# Patient Record
Sex: Male | Born: 1975
Health system: Southern US, Community
[De-identification: ages and names within clinical notes are randomized; demographics above are authoritative.]

## PROBLEM LIST (undated history)

## (undated) DIAGNOSIS — M549 Dorsalgia, unspecified: Secondary | ICD-10-CM

## (undated) DIAGNOSIS — F329 Major depressive disorder, single episode, unspecified: Secondary | ICD-10-CM

## (undated) HISTORY — PX: TONSILLECTOMY: SUR1361

## (undated) HISTORY — PX: OTHER SURGICAL HISTORY: SHX169

## (undated) HISTORY — DX: Major depressive disorder, single episode, unspecified: F32.9

## (undated) HISTORY — PX: HERNIA REPAIR: SHX51

---

## 2007-01-26 ENCOUNTER — Encounter: Payer: Self-pay | Admitting: Family Medicine

## 2007-03-09 ENCOUNTER — Encounter: Payer: Self-pay | Admitting: Family Medicine

## 2007-03-21 ENCOUNTER — Encounter: Admission: RE | Admit: 2007-03-21 | Discharge: 2007-03-21 | Payer: Self-pay | Admitting: Sports Medicine

## 2007-04-04 ENCOUNTER — Encounter: Admission: RE | Admit: 2007-04-04 | Discharge: 2007-07-03 | Payer: Self-pay | Admitting: Sports Medicine

## 2007-07-31 ENCOUNTER — Encounter: Payer: Self-pay | Admitting: Family Medicine

## 2007-08-07 ENCOUNTER — Ambulatory Visit: Payer: Self-pay | Admitting: Family Medicine

## 2007-08-08 ENCOUNTER — Ambulatory Visit: Payer: Self-pay | Admitting: Vascular Surgery

## 2007-08-09 ENCOUNTER — Telehealth: Payer: Self-pay | Admitting: Family Medicine

## 2007-08-10 ENCOUNTER — Encounter: Payer: Self-pay | Admitting: Family Medicine

## 2007-08-13 ENCOUNTER — Ambulatory Visit: Payer: Self-pay | Admitting: Family Medicine

## 2007-08-14 LAB — CONVERTED CEMR LAB
Basophils Relative: 1 % (ref 0–1)
Eosinophils Absolute: 0.4 10*3/uL (ref 0.0–0.7)
Lymphocytes Relative: 22 % (ref 12–46)
MCV: 89 fL (ref 78.0–100.0)
Monocytes Absolute: 0.4 10*3/uL (ref 0.1–1.0)
Monocytes Relative: 5 % (ref 3–12)
Neutrophils Relative %: 68 % (ref 43–77)
Platelets: 200 10*3/uL (ref 150–400)
RBC: 5 M/uL (ref 4.22–5.81)
RDW: 12.6 % (ref 11.5–15.5)
WBC: 8.2 10*3/uL (ref 4.0–10.5)

## 2007-08-16 ENCOUNTER — Ambulatory Visit: Payer: Self-pay | Admitting: Family Medicine

## 2007-08-16 DIAGNOSIS — F329 Major depressive disorder, single episode, unspecified: Secondary | ICD-10-CM

## 2007-08-16 DIAGNOSIS — F3289 Other specified depressive episodes: Secondary | ICD-10-CM

## 2007-08-16 HISTORY — DX: Other specified depressive episodes: F32.89

## 2007-08-16 HISTORY — DX: Major depressive disorder, single episode, unspecified: F32.9

## 2007-08-17 ENCOUNTER — Ambulatory Visit: Payer: Self-pay | Admitting: Sports Medicine

## 2007-08-17 ENCOUNTER — Telehealth: Payer: Self-pay | Admitting: Family Medicine

## 2007-08-18 ENCOUNTER — Encounter: Payer: Self-pay | Admitting: Sports Medicine

## 2007-08-27 ENCOUNTER — Encounter: Payer: Self-pay | Admitting: Family Medicine

## 2008-07-09 ENCOUNTER — Ambulatory Visit: Payer: Self-pay | Admitting: Family Medicine

## 2008-07-21 ENCOUNTER — Encounter: Payer: Self-pay | Admitting: Family Medicine

## 2008-07-28 ENCOUNTER — Encounter: Payer: Self-pay | Admitting: Family Medicine

## 2008-08-11 ENCOUNTER — Encounter: Payer: Self-pay | Admitting: Family Medicine

## 2008-10-01 ENCOUNTER — Ambulatory Visit: Payer: Self-pay | Admitting: Family Medicine

## 2008-10-02 ENCOUNTER — Encounter: Payer: Self-pay | Admitting: Family Medicine

## 2008-10-07 LAB — CONVERTED CEMR LAB
Sed Rate: 2 mm/hr (ref 0–16)
Total CK: 49 units/L (ref 7–232)

## 2009-01-11 ENCOUNTER — Ambulatory Visit: Payer: Self-pay | Admitting: Family Medicine

## 2009-02-02 ENCOUNTER — Ambulatory Visit: Payer: Self-pay | Admitting: Family Medicine

## 2009-02-03 ENCOUNTER — Encounter (INDEPENDENT_AMBULATORY_CARE_PROVIDER_SITE_OTHER): Payer: Self-pay | Admitting: *Deleted

## 2009-02-04 ENCOUNTER — Encounter: Payer: Self-pay | Admitting: Family Medicine

## 2009-02-05 ENCOUNTER — Telehealth: Payer: Self-pay | Admitting: Family Medicine

## 2009-02-05 ENCOUNTER — Encounter (INDEPENDENT_AMBULATORY_CARE_PROVIDER_SITE_OTHER): Payer: Self-pay | Admitting: *Deleted

## 2009-02-05 LAB — CONVERTED CEMR LAB
ALT: 11 units/L (ref 0–53)
Albumin: 4.7 g/dL (ref 3.5–5.2)
BUN: 16 mg/dL (ref 6–23)
Creatinine, Ser: 0.89 mg/dL (ref 0.40–1.50)
Glucose, Bld: 85 mg/dL (ref 70–99)
HDL: 33 mg/dL — ABNORMAL LOW (ref >39)
LDL Cholesterol: 108 mg/dL — ABNORMAL HIGH (ref 0–99)
Sodium: 143 meq/L (ref 135–145)
Total Protein: 7 g/dL (ref 6.0–8.3)

## 2009-02-10 ENCOUNTER — Encounter: Payer: Self-pay | Admitting: Family Medicine

## 2009-02-13 ENCOUNTER — Encounter: Payer: Self-pay | Admitting: Family Medicine

## 2009-04-29 ENCOUNTER — Telehealth: Payer: Self-pay | Admitting: Family Medicine

## 2009-06-22 ENCOUNTER — Ambulatory Visit: Payer: Self-pay | Admitting: Occupational Medicine

## 2009-06-25 ENCOUNTER — Encounter: Payer: Self-pay | Admitting: Family Medicine

## 2009-07-06 ENCOUNTER — Encounter: Admission: RE | Admit: 2009-07-06 | Discharge: 2009-07-06 | Payer: Self-pay | Admitting: Family Medicine

## 2009-07-06 ENCOUNTER — Ambulatory Visit: Payer: Self-pay | Admitting: Family Medicine

## 2009-07-20 ENCOUNTER — Telehealth: Payer: Self-pay | Admitting: Family Medicine

## 2009-08-21 ENCOUNTER — Encounter: Payer: Self-pay | Admitting: Family Medicine

## 2009-09-03 ENCOUNTER — Ambulatory Visit: Payer: Self-pay | Admitting: Family Medicine

## 2009-09-03 ENCOUNTER — Encounter (INDEPENDENT_AMBULATORY_CARE_PROVIDER_SITE_OTHER): Payer: Self-pay | Admitting: *Deleted

## 2009-09-03 DIAGNOSIS — B360 Pityriasis versicolor: Secondary | ICD-10-CM

## 2009-10-13 ENCOUNTER — Encounter: Payer: Self-pay | Admitting: Family Medicine

## 2010-03-02 NOTE — Miscellaneous (Signed)
Summary: PT Progress Note/[facility]  PT Progress Note/[facility]   Imported By: Lanelle Bal 03/09/2009 10:47:34  _____________________________________________________________________  External Attachment:    Type:   Image     Comment:   External Document

## 2010-03-02 NOTE — Letter (Signed)
Summary: [facility]   Imported By: Lanelle Bal 07/16/2009 11:09:53  _____________________________________________________________________  External Attachment:    Type:   Image     Comment:   External Document

## 2010-03-02 NOTE — Assessment & Plan Note (Signed)
Summary: tinea versicolor   Vital Signs:  Patient profile:   35 year old male Height:      70.5 inches Weight:      152 pounds BMI:     21.58 O2 Sat:      100 % on Room air Pulse rate:   86 / minute BP sitting:   135 / 82  (left arm) Cuff size:   large  Vitals Entered By: Payton Spark CMA (September 03, 2009 8:36 AM)  O2 Flow:  Room air CC: Small ?freckle on R pinky x 2 weeks. Small red spots on abd. Also c/o heat rash on arms.   Primary Care [place] [place]all:  Seymour Bars DO  CC:  Small ?freckle on R pinky x 2 weeks. Small red spots on abd. Also c/o heat rash on arms..  History of Present Illness: 35 yo WM presents for a tiny spot on his R pinky x 2 wks w/o trauma, a chronic and recurring rash on his trunk and UEs and for me to check his capillary hemangiomas.  Denies any pain, fevers, sore throat.  Not using any topical products.  Has never had skin cancer.  Has fam hx of capillary hemangiomas.  He wants them removed for cosmetic reasons and he does not have a dermatologist.  Current Medications (verified): 1)  Daily Multi  Tabs (Multiple Vitamins-Minerals) .... Once Daily  Allergies (verified): No Known Drug Allergies  Social History: Reviewed history from 08/07/2007 and no changes required. 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  No kids. Never smoked. Occas. ETOH. Healthy diet. Used to be avid runner.  Review of Systems      See HPI  Physical Exam  Skin:  tinea versicolor rash on trunk with classic hyperpigmented ovals in Christmas tree distribution with scaling.  'heat rash' over both upper arms.  a 0.1 cm macule over distal tuft of R pinky.  0.6 cm capillary hemangioma just lateral to his tatoo on L shoulder with pinpoint capillary hemangiomas over the chest.   Impression & Recommendations:  Problem # 1:  TINEA VERSICOLOR (ICD-111.0) Chronic, recurring. Start use of [employer] as a body wash in the shower daily.  he  declined RX treatment since he's had this for years.  Problem # 2:  CAPILLARY HEMANGIOMA (ICD-228.00) Assessment: Unchanged Reminded pt that these are cosmetic and hereditary.  he wants to see derm to find out about laser treatment.  Referral made.  Pinky lesion may be an early hemangioma vs a freckle. Orders: Dermatology Referral (Derma)  Complete Medication List: 1)  Daily Multi Tabs (Multiple vitamins-minerals) .... Once daily  Patient Instructions: 1)  Use Selsun Blue Shampoo as a body wash for tinea versicolor. 2)  Will schedule you with [facility] derm re: mole check and laser treatment for capillary hemangiomas.

## 2010-03-02 NOTE — Miscellaneous (Signed)
Summary: Upper Extremity Eval/[facility]  Upper Extremity Eval/ Physical Therapy   Imported By: Lanelle Bal 08/27/2009 09:17:04  _____________________________________________________________________  External Attachment:    Type:   Image     Comment:   External Document

## 2010-03-02 NOTE — Assessment & Plan Note (Signed)
Summary: HEAD COLD/RIGHT GROIN AREA /HIP PAIN   Vital Signs:  Patient Profile:   35 Years Old Male CC:      right hip/groin pain, chills, body aches, sore throat, sinus congestion X last night Height:     70.5 inches Weight:      151 pounds O2 Sat:      100 % O2 treatment:    Room Air Temp:     99.5 degrees F oral Pulse rate:   98 / minute Pulse rhythm:   regular Resp:     14 per minute BP sitting:   134 / 83  (right arm) Cuff size:   regular  Pt. in pain?   yes    Location:   right hip     Intensity:   4    Type:       aching  Vitals Entered By: Lajean Saver, RN                   Updated Prior Medication List: DAILY MULTI  TABS (MULTIPLE VITAMINS-MINERALS) once daily  Current Allergies (reviewed today): No known allergies History of Present Illness Chief Complaint: right hip/groin pain, chills, body aches, sore throat, sinus congestion X last night History of Present Illness: Presents with a 24 hour history of sinus congestion, sore throat,  generalized body aches, fever and chills.   Says he felt fine until late yesterday afternoon and then "felt like a truck hit me".   Feels a little better this am.    Rapid strep test was negative.    Also complains of right flank and inguinal discomfort intermittently.   Says that he gets discomfort "every once in a while".  He has had inguinal hernia repair twice on the left.   No prior history of kidney stones.    REVIEW OF SYSTEMS Constitutional Symptoms       Complains of fever, chills, night sweats, and fatigue.     Denies weight loss and weight gain.  Eyes       Denies change in vision, eye pain, eye discharge, glasses, contact lenses, and eye surgery. Ear/Nose/Throat/Mouth       Complains of sinus problems and sore throat.      Denies hearing loss/aids, change in hearing, ear pain, ear discharge, dizziness, frequent runny nose, frequent nose bleeds, hoarseness, and tooth pain or bleeding.  Respiratory       Complains of  dry cough.      Denies productive cough, wheezing, shortness of breath, asthma, bronchitis, and emphysema/COPD.  Cardiovascular       Denies murmurs, chest pain, and tires easily with exhertion.    Gastrointestinal       Denies stomach pain, nausea/vomiting, diarrhea, constipation, blood in bowel movements, and indigestion. Genitourniary       Denies painful urination, kidney stones, and loss of urinary control. Neurological       Denies paralysis, seizures, and fainting/blackouts. Musculoskeletal       Complains of muscle pain and joint pain.      Denies joint stiffness, decreased range of motion, redness, swelling, muscle weakness, and gout.      Comments: right hip/groin pain Skin       Denies bruising, unusual mles/lumps or sores, and hair/skin or nail changes.  Psych       Denies mood changes, temper/anger issues, anxiety/stress, speech problems, depression, and sleep problems. Other Comments: right hip had previously torn tendon, patient was a runner for 9 years, same re-occuring  pain X    Past History:  Past Medical History: recurrent RLQ/ R groin pain-- did PT with Georgie Chard, due to abd muscle weakness pes planus L >R peroneal / achilles tendonosis 07-2008  Past Surgical History: Reviewed history from 08/07/2007 and no changes required. L inguinal hernia repair 1991, 2002 (in [facility]) tonsillectomy 1987  Family History: Reviewed history from 02/02/2009 and no changes required. father DM, high chol, HTN mother healthy PGF died of AMI at 59. only child  Social History: Reviewed history from 08/07/2007 and no changes required. 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  No kids. Never smoked. Occas. ETOH. Healthy diet. Used to be avid runner. Physical Exam General appearance: well developed, well nourished, no acute distress Pupils: equal, round, reactive to light Ears: normal, no lesions or deformities Nasal: swollen red turbinates  with congestion Oral/Pharynx: pharyngeal erythema without exudate, uvula midline without deviation Neck: supple,anterior lymphadenopathy present GU: Loose right inguinal ring.  No masses.   Assessment New Problems: UPPER RESPIRATORY INFECTION, ACUTE (ICD-465.9)   Plan New Medications/Changes: LIDOCAINE VISCOUS 2 % SOLN (LIDOCAINE HCL) 15ml by mouth q3 to 4hr as needed.  Swish and spit out.  Max 8 doses/day  #200cc x 0, 06/22/2009, Kathrine Haddock MD  New Orders: Est. Patient Level II [phone] Planning Comments:   Rapid strep testing was negative Sample sent out for culture Lidocaine viscous for sore throat symptoms Trace blood on UA noted.   Right flank/groin discomfort could be kidney stones vs mild inguinal hernia.  Recommend by mouth hydration and follow up with PCP if symptoms do not resolve in 1-2 days.      The patient and/or caregiver has been counseled thoroughly with regard to medications prescribed including dosage, schedule, interactions, rationale for use, and possible side effects and they verbalize understanding.  Diagnoses and expected course of recovery discussed and will return if not improved as expected or if the condition worsens. Patient and/or caregiver verbalized understanding.  Prescriptions: LIDOCAINE VISCOUS 2 % SOLN (LIDOCAINE HCL) 15ml by mouth q3 to 4hr as needed.  Swish and spit out.  Max 8 doses/day  #200cc x 0   Entered and Authorized by:   Kathrine Haddock MD   Signed by:   Kathrine Haddock MD on 06/22/2009   Method used:   Electronically to        [facility] [phone][facility] (retail)       [address] [place], [place]  [address]       Ph: [phone] or [phone]       Fax: [phone]   RxID:   [id number]   Laboratory Results  Date/Time Received: Jun 22, 2009 8:34 AM  Date/Time Reported: Jun 22, 2009 8:34 AM   Other Tests  Rapid Strep: negative  Kit Test Internal QC: Negative   (Normal Range: Negative)   Appended Document: HEAD  COLD/RIGHT GROIN AREA /HIP PAIN UA Results: [id number] BIL: negative KET: Neg SG>= 1.030 BLO: Trace-intact pH: 5.0 PRO: 30 URO: 0.2 NIT: Neg LEU: Neg

## 2010-03-02 NOTE — Progress Notes (Signed)
Summary: pt. needs a referral  Phone Note Call from Patient   Caller: Patient Summary of Call:  pt .states that  him  and Dr. Cathey Endow had talked about getting a referral for P.T. if he was not better in 2 weeks & pt. is not better. So can he have a referral without being seen again? Call patient back and let him know the status at [phone] Initial call taken by: Michaelle Copas,  July 20, 2009 11:25 AM  Follow-up for Phone Call        no.  will refer. Follow-up by: Seymour Bars DO,  July 20, 2009 11:45 AM

## 2010-03-02 NOTE — Letter (Signed)
Summary: Primary Care Consult Scheduled Letter  [place] at [facility]  [address]   [place], [place] [address]   Phone: [phone]  Fax: [phone]      02/05/2009 MRN: [id number]  [place] [address] [place], [place]  [address]    Dear Mr. Eckford,      We have scheduled an appointment for you.  At the recommendation of Dr.Bowen , we have scheduled you a consult with [facility]  , Dr Gerrit Friends  on January 11 ,2011 at 2pm .  Their address is_[address]   , [place]  [phone]. The office phone number is [phone].  If this appointment day and time is not convenient for you, please feel free to call the office of the doctor you are being referred to at the number listed above and reschedule the appointment.     It is important for you to keep your scheduled appointments. We are here to make sure you are given good patient care. If you have questions or you have made changes to your appointment, please notify us at  [phone], ask for [facility].    Thank you,  Patient Care Coordinator [place] at [facility]

## 2010-03-02 NOTE — Progress Notes (Signed)
Summary: Surgery referral  Phone Note Call from Patient   Caller: Patient Summary of Call: Pt changed his mind about seeing a surgeon and now would like referral to surgeon. Requests [facility] in [place].  Initial call taken by: Payton Spark CMA,  February 05, 2009 9:02 AM  New Problems: OTHER POSTSURGICAL STATUS OTHER (ICD-V45.89)   New Problems: OTHER POSTSURGICAL STATUS OTHER (ICD-V45.89)  Appended Document: Surgery referral referral put in.  Seymour Bars, D.O.

## 2010-03-02 NOTE — Assessment & Plan Note (Signed)
Summary: L shoulder pain   Vital Signs:  Patient profile:   35 year old male Height:      70.5 inches Weight:      152 pounds BMI:     21.58 O2 Sat:      99 % on Room air Pulse rate:   82 / minute BP sitting:   119 / 70  (left arm) Cuff size:   large  Vitals Entered By: Payton Spark CMA (July 06, 2009 3:05 PM)  O2 Flow:  Room air CC: L neck and shoulder pain x 5 days. No known injury.   Primary Care Provider:  Seymour Bars DO  CC:  L neck and shoulder pain x 5 days. No known injury.Marland Kitchen  History of Present Illness: 35 yo WM presents for pain over the L side of his neck and shoulder x 5 days.  Denies any trauma or overuse.  It seemed to get worse b/w Thursday and Friday.  Pain over the L trapezious muscle.  Better with rest.  Able to sleep OK.  He wakes up w/o pain but it comes later during the day.  He did some push ups at the gym right before this started.  He also did tricept dips that was new at the gym.  The pain radiates down to the deltoid but now all the way down the arm.  He is L handed.  Denies any weakness.  His pain is mild.    Current Medications (verified): 1)  Daily Multi  Tabs (Multiple Vitamins-Minerals) .... Once Daily  Allergies (verified): No Known Drug Allergies  Past History:  Past Medical History: Reviewed history from 06/22/2009 and no changes required. recurrent RLQ/ R groin pain-- did PT with Georgie Chard, due to abd muscle weakness pes planus L >R peroneal / achilles tendonosis 07-2008  Social History: Reviewed history from 08/07/2007 and no changes required. 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  No kids. Never smoked. Occas. ETOH. Healthy diet. Used to be avid runner.  Review of Systems      See HPI  Physical Exam  General:  alert, well-developed, well-nourished, and well-hydrated.   Msk:  full cspine and L glenohumeral ROM tender over superior and posterior trapezious muscle.  tenderness induced with  SB neck to the R.  Nontender over [facility] joint with neg empty can test,neg hawkins test, normal bicept/ tricept testing and full grip strength bilat Pulses:  2+ radial pulses   Impression & Recommendations:  Problem # 1:  SHOULDER PAIN, LEFT (ICD-719.41) 5 days of non traumatic shoulder pain which could be coming from the neck vs trapezious strain vs AC arthritis.  Will XRay today given hx of clavicular fracture.  Relative rest x 2 wks with as needed use of Advil.  If not improved in 2 wks, refer back to PT. Orders: T-DG Shoulder*L* ([id number])  Complete Medication List: 1)  Daily Multi Tabs (Multiple vitamins-minerals) .... Once daily  Patient Instructions: 1)  Hold off on upper body strength training for 2 wks. 2)  OK to do cardio. 3)  Use Advil as needed for pain. 4)  Xray L shoulder today. 5)  Will call you w/ results tomorrow. 6)  If not improved in 2 wks, will refer to Georgie Chard for PT.

## 2010-03-02 NOTE — Assessment & Plan Note (Signed)
Summary: CPE   Vital Signs:  Patient profile:   35 year old male Height:      70.5 inches Weight:      154 pounds BMI:     21.86 O2 Sat:      100 % on Room air Temp:     97.0 degrees F oral Pulse rate:   74 / minute BP sitting:   138 / 79  (left arm) Cuff size:   regular  Vitals Entered By: Payton Spark CMA (February 02, 2009 10:47 AM)  O2 Flow:  Room air CC: CPE   Primary Care Provider:  Seymour Bars DO  CC:  CPE.  History of Present Illness: 35 yo WM presents for a CPE.    He is a healthy male with no medical problems.  He has done months of PT for ankle pain.  He is wearing orthotics.  He is walking but not running.  He is doing some swimming, lunges, squats.  He he having R>L sided pain.  He is about 85% better.  He has hx of inguinal hernia repair (bilat) with the L one done in [place] in 02.  He has had some aches and pains over the years in this region and had a CT done that was normal other than some small adhesions.  He is paranoid that he is causing himself problems.  No bulding, redness, constipation problems.  He is due for fasting labs.  Declines tetanus vaccine. Denies fam hx for colon or prostate cancer.    Allergies: No Known Drug Allergies  Past History:  Past Medical History: Reviewed history from 10/01/2008 and no changes required. recurrent RLQ/ R groin pain-- did PT with Georgie Chard, due to abd muscle weakness pes planus L >R peroneal / achilles tendonosis 07-2008  Past Surgical History: Reviewed history from 08/07/2007 and no changes required. L inguinal hernia repair 1991, 2002 (in [facility]) tonsillectomy 1987  Family History: Reviewed history from 08/07/2007 and no changes required. father DM, high chol, HTN mother healthy PGF died of AMI at 36. only child  Social History: Reviewed history from 08/07/2007 and no changes required. 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  No kids. Never smoked. Occas.  ETOH. Healthy diet. Used to be avid runner.  Review of Systems  The patient denies anorexia, fever, weight loss, weight gain, vision loss, decreased hearing, hoarseness, chest pain, syncope, dyspnea on exertion, peripheral edema, prolonged cough, headaches, hemoptysis, abdominal pain, melena, hematochezia, severe indigestion/heartburn, hematuria, incontinence, genital sores, muscle weakness, suspicious skin lesions, transient blindness, difficulty walking, depression, unusual weight change, abnormal bleeding, enlarged lymph nodes, angioedema, breast masses, and testicular masses.    Physical Exam  General:  alert, well-developed, well-nourished, and well-hydrated.   Head:  normocephalic and atraumatic.   Eyes:  pupils equal, pupils round, and pupils reactive to light.   Ears:  EACs patent; TMs translucent and gray with good cone of light and bony landmarks.  Nose:  no nasal discharge.   Mouth:  good dentition and pharynx pink and moist.   Neck:  no masses.   Lungs:  Normal respiratory effort, chest expands symmetrically. Lungs are clear to auscultation, no crackles or wheezes. Heart:  Normal rate and regular rhythm. S1 and S2 normal without gallop, murmur, click, rub or other extra sounds. Abdomen:  Bowel sounds positive,abdomen soft and non-tender without masses, organomegaly or hernias noted.  L inguinal incisional scar intact w/o pain or bulging  Extremities:  L> R pes planus Skin:  blister over tip of the R pinky toe fair skinned. no suspicious lesions.   Cervical Nodes:  No lymphadenopathy noted Psych:  flat affect.     Impression & Recommendations:  Problem # 1:  HEALTH SCREENING (ICD-V70.0)  Keeping healthy checklist for men reviewed. BP in normal range.  Update fasting labs. Declined Tdap update today. Derm referral for annual skin surveillance (nothing abnormal on exam). BMI at goal. Work on [employer], regular exercise, mens MVI daily. Concerned over pt's recurring  anxiety about health -- he verbally declines having any mood problems.  Orders: Dermatology Referral (Derma)  Other Orders: T-Comprehensive Metabolic Panel [id number]) T-Lipid Profile [id number])  Patient Instructions: 1)  Call if L sided groin pain worsens. 2)  Have fasting labs drawn and I will call you with results the following day.

## 2010-03-02 NOTE — Consult Note (Signed)
Summary: [facility]  [facility]   Imported By: Lanelle Bal 12/12/2009 11:39:39  _____________________________________________________________________  External Attachment:    Type:   Image     Comment:   External Document

## 2010-03-02 NOTE — Consult Note (Signed)
Summary: [facility]   Imported By: Lanelle Bal 03/04/2009 08:56:59  _____________________________________________________________________  External Attachment:    Type:   Image     Comment:   External Document

## 2010-03-02 NOTE — Letter (Signed)
Summary: Primary Care Consult Scheduled Letter  [place] at [facility]  [address]   [place], [place] [address]   Phone: [phone]  Fax: [phone]      02/03/2009 MRN: [id number]  [place] Patrick [address] [place], [place]  [address]    Dear [name],      We have scheduled an appointment for you.  At the recommendation of Dr._Bowen , we have scheduled you a consult with [facility] , Dr Stefanie Libel on _January 13, 2011 at 9am  , arrive 8:45am .  Their address is_[address]  , [place] . The office phone number is [phone].  If this appointment day and time is not convenient for you, please feel free to call the office of the doctor you are being referred to at the number listed above and reschedule the appointment.     It is important for you to keep your scheduled appointments. We are here to make sure you are given good patient care. If you have questions or you have made changes to your appointment, please notify us at  [phone], ask for [facility].    Thank you,  Patient Care Coordinator [place] at [facility]

## 2010-03-02 NOTE — Letter (Signed)
Summary: Primary Care Consult Scheduled Letter

## 2010-03-02 NOTE — Progress Notes (Signed)
Summary: Ortho referral  Phone Note Call from Patient   Caller: Patient Summary of Call: Pt has completed PT and now would like referral to ortho in [place]. Please advise. Initial call taken by: Payton Spark CMA,  April 29, 2009 10:10 AM  Follow-up for Phone Call        ok Follow-up by: Seymour Bars DO,  April 29, 2009 12:31 PM

## 2010-06-18 ENCOUNTER — Encounter: Payer: Self-pay | Admitting: Family Medicine

## 2010-06-18 ENCOUNTER — Ambulatory Visit (INDEPENDENT_AMBULATORY_CARE_PROVIDER_SITE_OTHER): Payer: BC Managed Care – PPO | Admitting: Family Medicine

## 2010-06-18 DIAGNOSIS — W57XXXA Bitten or stung by nonvenomous insect and other nonvenomous arthropods, initial encounter: Secondary | ICD-10-CM

## 2010-06-18 DIAGNOSIS — R030 Elevated blood-pressure reading, without diagnosis of hypertension: Secondary | ICD-10-CM

## 2010-06-18 DIAGNOSIS — T148 Other injury of unspecified body region: Secondary | ICD-10-CM

## 2010-06-18 DIAGNOSIS — Z13228 Encounter for screening for other metabolic disorders: Secondary | ICD-10-CM

## 2010-06-18 DIAGNOSIS — Z23 Encounter for immunization: Secondary | ICD-10-CM

## 2010-06-18 DIAGNOSIS — Z1329 Encounter for screening for other suspected endocrine disorder: Secondary | ICD-10-CM

## 2010-06-18 DIAGNOSIS — Z13220 Encounter for screening for lipoid disorders: Secondary | ICD-10-CM

## 2010-06-18 NOTE — Progress Notes (Signed)
  Subjective:    Patient ID: Hayden Patrick, male    DOB: 10/29/1975, 35 y.o.   MRN: [id number]  HPI  35 yo WM presents for about a year of BP slowly coming up.  He has a strong fam hx of HTN.  He is running 130s -140s/ 90s outside of here.  He did work out this morning.  He has not been working out a little less over the past year given foot problems.  He stopped running but is now hiking and cycling.   Denies problems voiding, no HAs, vision changes or edema.  He is only up 3 lbs from 1 yr ago.  Due for fasting labs.  He is due for a Tdap vaccine.   He reports his diet to be fair with some room for improvement.  He is due for fasting labs.  He went to [employer] after a trip to [place] 2 days ago.  He was put on Doxycyline for a tick bite.  The bite is red but does not hurt.  No fevers, chills or rash.  BP 132/79  Pulse 95  Ht 5\' 11"  (1.803 m)  Wt 155 lb (70.308 kg)  BMI 21.62 kg/m2  SpO2 99%     Review of Systems  Constitutional: Negative for fever, chills and fatigue.  HENT: Negative for neck pain.   Respiratory: Negative for cough, chest tightness and shortness of breath.   Cardiovascular: Negative for chest pain, palpitations and leg swelling.  Musculoskeletal: Negative for back pain.  Neurological: Negative for headaches.  Psychiatric/Behavioral: Negative for dysphoric mood. The patient is not nervous/anxious.        Objective:   Physical Exam  Constitutional: He appears well-developed and well-nourished. No distress.  Eyes: Pupils are equal, round, and reactive to light. No scleral icterus.  Neck: Neck supple. No thyromegaly present.  Cardiovascular: Normal rate, regular rhythm and normal heart sounds.   No murmur heard. Pulmonary/Chest: Effort normal and breath sounds normal. No respiratory distress. He has no wheezes.  Musculoskeletal: He exhibits no edema.  Lymphadenopathy:    He has no cervical adenopathy.  Skin: Skin is warm and dry.       Insect bite with localized  erythema L groin  Psychiatric: He has a normal mood and affect.          Assessment & Plan:

## 2010-06-18 NOTE — Assessment & Plan Note (Signed)
BP into the pre-HTN range here, slightly higher at home over the last year with strong fam hx of HTN.  No substantial change in wieght and continues to exercise 5-6 days/ wk.  We discussed diet and h/o given today on DASH diet.  RTC for a nurse BP check in 3 mos.

## 2010-06-18 NOTE — Patient Instructions (Signed)
Read info on DASH Diet.   Keep up the good work with regular exercise.  Update fasting labs one morning downstairs. Will call you w/ results.  Return for a nurse visit BP check in 3 mos.  Finish out doxycycline and keep insect bite clean with antibacterial soap and water daily.

## 2010-06-18 NOTE — Assessment & Plan Note (Signed)
L proximal thigh groin occuring while hiking in [place].  Within 2 days of the bite, he was started on Doxycycline by [facility] - unknown if this was a tickbite.  Appears to be more of a spider bite.  No sign of cellulitis.  He is to finish out the doxy and keep area clean.  Call if any problems.  Tdap updated today.

## 2010-07-22 ENCOUNTER — Encounter: Payer: Self-pay | Admitting: Family Medicine

## 2010-07-22 ENCOUNTER — Inpatient Hospital Stay (INDEPENDENT_AMBULATORY_CARE_PROVIDER_SITE_OTHER)
Admission: RE | Admit: 2010-07-22 | Discharge: 2010-07-22 | Disposition: A | Discharge status: Home / Self Care | Payer: BC Managed Care – PPO | Source: Ambulatory Visit | Attending: Family Medicine | Admitting: Family Medicine

## 2010-07-22 DIAGNOSIS — R109 Unspecified abdominal pain: Secondary | ICD-10-CM

## 2010-07-27 ENCOUNTER — Telehealth (INDEPENDENT_AMBULATORY_CARE_PROVIDER_SITE_OTHER): Payer: Self-pay | Admitting: *Deleted

## 2010-11-30 ENCOUNTER — Inpatient Hospital Stay (INDEPENDENT_AMBULATORY_CARE_PROVIDER_SITE_OTHER)
Admission: RE | Admit: 2010-11-30 | Discharge: 2010-11-30 | Disposition: A | Discharge status: Home / Self Care | Payer: BC Managed Care – PPO | Source: Ambulatory Visit | Attending: Emergency Medicine | Admitting: Emergency Medicine

## 2010-11-30 ENCOUNTER — Encounter: Payer: Self-pay | Admitting: Emergency Medicine

## 2010-11-30 DIAGNOSIS — R1909 Other intra-abdominal and pelvic swelling, mass and lump: Secondary | ICD-10-CM

## 2010-11-30 DIAGNOSIS — R109 Unspecified abdominal pain: Secondary | ICD-10-CM

## 2010-12-07 ENCOUNTER — Ambulatory Visit (INDEPENDENT_AMBULATORY_CARE_PROVIDER_SITE_OTHER): Payer: BC Managed Care – PPO | Admitting: General Surgery

## 2010-12-07 ENCOUNTER — Encounter (INDEPENDENT_AMBULATORY_CARE_PROVIDER_SITE_OTHER): Payer: Self-pay | Admitting: General Surgery

## 2010-12-07 VITALS — BP 148/96 | HR 84 | Temp 98.6°F | Resp 12 | Ht 71.0 in | Wt 163.0 lb

## 2010-12-07 DIAGNOSIS — R1031 Right lower quadrant pain: Secondary | ICD-10-CM

## 2010-12-07 NOTE — Progress Notes (Signed)
Subjective:     Patient ID: Hayden Patrick, male   DOB: [birth year], 35 y.o.   MRN: [id number]  HPI We are asked to see the patient in consultation by Dr. Seymour Bars to evaluate him for a right inguinal hernia. The patient is a 35 year old white male who first noticed a bulge in his right groin about a week ago after having sex. When he pushed on and he said it went down and went away. He is had no nausea or vomiting. His bowels move regularly. He has a history of a left inguinal hernia that was repaired twice the last time with a mesh plug. Review of Systems  Constitutional: Negative.   HENT: Negative.   Eyes: Negative.   Respiratory: Negative.   Cardiovascular: Negative.   Gastrointestinal: Negative.   Genitourinary: Negative.   Musculoskeletal: Negative.   Skin: Negative.   Hematological: Negative.   Psychiatric/Behavioral: Negative.        Objective:   Physical Exam  Constitutional: He is oriented to person, place, and time. He appears well-developed and well-nourished.  HENT:  Head: Normocephalic and atraumatic.  Eyes: Conjunctivae and EOM are normal. Pupils are equal, round, and reactive to light.  Neck: Normal range of motion. Neck supple.  Cardiovascular: Normal rate, regular rhythm and normal heart sounds.   Pulmonary/Chest: Effort normal and breath sounds normal.  Abdominal: Soft. Bowel sounds are normal.  Genitourinary: Penis normal.       The patient has no obvious bulge or impulse with straining in either groin.  Musculoskeletal: Normal range of motion.  Neurological: He is alert and oriented to person, place, and time.  Skin: Skin is warm and dry.  Psychiatric: He has a normal mood and affect. His behavior is normal.       Assessment:     Right groin pain. No definite clinical evidence of a right inguinal hernia    Plan:     At this point we will plan to obtain a CT scan of the pelvis to look for any radiographic evidence of a right inguinal hernia. We will  call him with the results of the scan and then proceed accordingly.

## 2010-12-07 NOTE — Patient Instructions (Signed)
We will call with CT results.

## 2010-12-08 ENCOUNTER — Other Ambulatory Visit (INDEPENDENT_AMBULATORY_CARE_PROVIDER_SITE_OTHER): Payer: Self-pay | Admitting: General Surgery

## 2010-12-08 NOTE — H&P (Signed)
Hayden J Selover   12/07/2010 10:10 AM Office Visit  MRN: [id number]   Description: 35 year old male  Provider: Robyne Askew, MD  Department: [facility]        Diagnoses     Right groin pain   - Primary    789.03      Reason for Visit     Other    new pt- eval RIH        Vitals - Last Recorded       BP Pulse Temp(Src) Resp Ht Wt    148/96  84  98.6 F (37 C) (Temporal)  12  5\' 11"  (1.803 m)  163 lb (73.936 kg)          BMI              22.73 kg/m2                 Progress Notes     Robyne Askew, MD  12/07/2010 10:32 AM  SignedSubjective:      Patient ID: Hayden Patrick, male   DOB: 18-Jan-1976, 35 y.o.   MRN: [id number]   HPI We are asked to see the patient in consultation by Dr. Seymour Bars to evaluate him for a right inguinal hernia. The patient is a 35 year old white male who first noticed a bulge in his right groin about a week ago after having sex. When he pushed on and he said it went down and went away. He is had no nausea or vomiting. His bowels move regularly. He has a history of a left inguinal hernia that was repaired twice the last time with a mesh plug. Review of Systems  Constitutional: Negative.   HENT: Negative.   Eyes: Negative.   Respiratory: Negative.   Cardiovascular: Negative.   Gastrointestinal: Negative.   Genitourinary: Negative.   Musculoskeletal: Negative.   Skin: Negative.   Hematological: Negative.   Psychiatric/Behavioral: Negative.       Objective:    Physical Exam  Constitutional: He is oriented to person, place, and time. He appears well-developed and well-nourished.  HENT:   Head: Normocephalic and atraumatic.  Eyes: Conjunctivae and EOM are normal. Pupils are equal, round, and reactive to light.  Neck: Normal range of motion. Neck supple.  Cardiovascular: Normal rate, regular rhythm and normal heart sounds.   Pulmonary/Chest: Effort normal and breath sounds normal.  Abdominal: Soft. Bowel sounds are normal.   Genitourinary: Penis normal.       The patient has no obvious bulge or impulse with straining in either groin.  Musculoskeletal: Normal range of motion.  Neurological: He is alert and oriented to person, place, and time.  Skin: Skin is warm and dry.  Psychiatric: He has a normal mood and affect. His behavior is normal.      Assessment:    Right groin pain. No definite clinical evidence of a right inguinal hernia   Plan:    At this point we will plan to obtain a CT scan of the pelvis to look for any radiographic evidence of a right inguinal hernia. We will call him with the results of the scan and then proceed accordingly.                Not recorded         Orders Placed This Encounter       Future Orders    CT Pelvis W Contrast [[id number] Custom]    Expires: 03/08/12  Patient Instructions     We will call with CT results       Level of Service     PR OFFICE CONSULTATION,LEVEL III [40981]         All Flowsheet Templates (all recorded)     Encounter Vitals Flowsheet    Custom Formula Data Flowsheet    Anthropometrics Flowsheet               Referring Provider          Seymour Bars, DO       All Charges for This Encounter       Code Description Service Date Service Provider Modifiers Quantity    [phone] PR OFFICE CONSULTATION,LEVEL III 12/07/2010 Robyne Askew, MD   1        Other Encounter Related Information     Allergies & Medications         Problem List         History         Patient-Entered Questionnaires     No data filed

## 2010-12-10 ENCOUNTER — Ambulatory Visit
Admission: RE | Admit: 2010-12-10 | Discharge: 2010-12-10 | Disposition: A | Discharge status: Home / Self Care | Payer: BC Managed Care – PPO | Source: Ambulatory Visit | Attending: General Surgery | Admitting: General Surgery

## 2010-12-10 DIAGNOSIS — R1031 Right lower quadrant pain: Secondary | ICD-10-CM

## 2010-12-10 MED ORDER — IOHEXOL 300 MG/ML  SOLN
100.0000 mL | Freq: Once | INTRAMUSCULAR | Status: AC | PRN
  Administered 2010-12-10: 100 mL via INTRAVENOUS

## 2010-12-13 ENCOUNTER — Telehealth (INDEPENDENT_AMBULATORY_CARE_PROVIDER_SITE_OTHER): Payer: Self-pay | Admitting: General Surgery

## 2011-01-03 NOTE — Progress Notes (Signed)
Summary: R ABDOMINAL PAIN Room 5   Vital Signs:  Patient Profile:   35 Years Old Male CC:      Rt groin pain x 3 days Height:     70.5 inches Weight:      149 pounds O2 Sat:      98 % O2 treatment:    Room Air Temp:     98.5 degrees F oral Pulse rate:   102 / minute Pulse rhythm:   regular Resp:     14 per minute BP sitting:   117 / 83  (left arm) Cuff size:   regular  Pt. in pain?   yes    Location:   pelvis    Intensity:   4    Type:       dull  Vitals Entered By: Emilio Math (July 22, 2010 5:21 PM)                   Current Allergies: No known allergies History of Present Illness Chief Complaint: Rt groin pain x 3 days History of Present Illness:  Subjective:  Patient complains of 2 to 3 day history of ache in right groin area, worse with flexing of hip.  No urinary symptoms.  No fevers, chills, and sweats.  No nausea/vomiting   Current Meds DAILY MULTI  TABS (MULTIPLE VITAMINS-MINERALS) once daily  REVIEW OF SYSTEMS Constitutional Symptoms      Denies fever, chills, night sweats, weight loss, weight gain, and fatigue.  Eyes       Denies change in vision, eye pain, eye discharge, glasses, contact lenses, and eye surgery. Ear/Nose/Throat/Mouth       Denies hearing loss/aids, change in hearing, ear pain, ear discharge, dizziness, frequent runny nose, frequent nose bleeds, sinus problems, sore throat, hoarseness, and tooth pain or bleeding.  Respiratory       Denies dry cough, productive cough, wheezing, shortness of breath, asthma, bronchitis, and emphysema/COPD.  Cardiovascular       Denies murmurs, chest pain, and tires easily with exhertion.    Gastrointestinal       Denies stomach pain, nausea/vomiting, diarrhea, constipation, blood in bowel movements, and indigestion.      Comments: Rt Lower abdominal to groin pain Genitourniary       Denies painful urination, kidney stones, and loss of urinary control. Neurological       Denies paralysis, seizures,  and fainting/blackouts. Musculoskeletal       Denies muscle pain, joint pain, joint stiffness, decreased range of motion, redness, swelling, muscle weakness, and gout.  Skin       Denies bruising, unusual mles/lumps or sores, and hair/skin or nail changes.  Psych       Denies mood changes, temper/anger issues, anxiety/stress, speech problems, depression, and sleep problems.  Past History:  Past Medical History: Reviewed history from 06/22/2009 and no changes required. recurrent RLQ/ R groin pain-- did PT with Georgie Chard, due to abd muscle weakness pes planus L >R peroneal / achilles tendonosis 07-2008  Past Surgical History: Reviewed history from 08/07/2007 and no changes required. L inguinal hernia repair 1991, 2002 (in [facility]) tonsillectomy 1987  Family History: Reviewed history from 02/02/2009 and no changes required. father DM, high chol, HTN mother healthy PGF died of AMI at 73. only child  Social History: Reviewed history from 08/07/2007 and no changes required. 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  No kids. Never smoked. Occas. ETOH. Healthy diet. Used to  be avid runner.   Objective:  Appearance:  Patient appears healthy, stated age, and in no acute distress  Neck:  Supple.  No adenopathy is present.   Lungs:  Clear to auscultation.  Breath sounds are equal.  Heart:  Regular rate and rhythm without murmurs, rubs, or gallops.  Abdomen:  There is distinct tenderness over the symphysis pubis.  Palpation there with resisted right hip flexion causes pain.  There is some mild tenderness about 2 to 3 inches above the right symphysis but no hernia palpated.  No masses or hepatosplenomegaly.  Bowel sounds are present.  No CVA or flank tenderness.  Genitourinary:  Penis normal without lesions or urethral discharge.  Scrotum is normal.  Testes are descended bilaterally without nodules or tenderness.  No hernias are palpated.  No regional  lymphadenopathy palpated.   Assessment New Problems: INGUINAL PAIN (ICD-789.09)  OSTEITIS PUBIS  Plan New Orders: Est. Patient Level III [84696] Planning Comments:   Begin applying ice pack several times daily.  Begin NSAID.  ([facility] information and instruction patient handout given)  Followup with [facility] if not improved in two weeks.   The patient and/or caregiver has been counseled thoroughly with regard to medications prescribed including dosage, schedule, interactions, rationale for use, and possible side effects and they verbalize understanding.  Diagnoses and expected course of recovery discussed and will return if not improved as expected or if the condition worsens. Patient and/or caregiver verbalized understanding.   Orders Added: 1)  Est. Patient Level III [29528]

## 2011-01-03 NOTE — Progress Notes (Signed)
Summary: Pain in right Groin   Vital Signs:  Patient Profile:   35 Years Old Male CC:      lump in right groin x this AM Height:     70.5 inches Weight:      162 pounds O2 Sat:      98 % O2 treatment:    Room Air Temp:     98.7 degrees F oral Pulse rate:   86 / minute Resp:     14 per minute BP sitting:   135 / 77  (left arm) Cuff size:   regular  Vitals Entered By: Lajean Saver RN (November 30, 2010 9:22 AM)                  Updated Prior Medication List: DAILY MULTI  TABS (MULTIPLE VITAMINS-MINERALS) once daily  Current Allergies: No known allergies History of Present Illness Chief Complaint: lump in right groin x this AM History of Present Illness: R groin lump and discomfort after having intercourse this morning.  He is monogamous with his fiancee.  No history of STDs.  He felt a lump on the R groin area that he was able to press back in and it is no longer present.  No pain.  He has a history of a hernia on his L side in the past (w/ 2 surgeries).  He lifts weights and has a 18 yo daughter.  No dysuria, hematuria, drainage, pain with sex.  He was here about 6 months ago and had R hip/inguinal pain then as well but that went away.  REVIEW OF SYSTEMS Constitutional Symptoms      Denies fever, chills, night sweats, weight loss, weight gain, and fatigue.  Eyes       Denies change in vision, eye pain, eye discharge, glasses, contact lenses, and eye surgery. Ear/Nose/Throat/Mouth       Denies hearing loss/aids, change in hearing, ear pain, ear discharge, dizziness, frequent runny nose, frequent nose bleeds, sinus problems, sore throat, hoarseness, and tooth pain or bleeding.  Respiratory       Denies dry cough, productive cough, wheezing, shortness of breath, asthma, bronchitis, and emphysema/COPD.  Cardiovascular       Denies murmurs, chest pain, and tires easily with exhertion.    Gastrointestinal       Denies stomach pain, nausea/vomiting, diarrhea, constipation, blood  in bowel movements, and indigestion. Genitourniary       Denies painful urination, blood or discharge from penis, kidney stones, and loss of urinary control. Neurological       Denies paralysis, seizures, and fainting/blackouts. Musculoskeletal       Denies muscle pain, joint pain, joint stiffness, decreased range of motion, redness, swelling, muscle weakness, and gout.  Skin       Denies bruising, unusual mles/lumps or sores, and hair/skin or nail changes.  Psych       Denies mood changes, temper/anger issues, anxiety/stress, speech problems, depression, and sleep problems. Other Comments: Patient noticed a lump in his right groin this AM post intercourse. c/o minimal pain   Past History:  Past Medical History: Reviewed history from 06/22/2009 and no changes required. recurrent RLQ/ R groin pain-- did PT with Georgie Chard, due to abd muscle weakness pes planus L >R peroneal / achilles tendonosis 07-2008  Past Surgical History: Reviewed history from 08/07/2007 and no changes required. L inguinal hernia repair 1991, 2002 (in [facility]) tonsillectomy 1987  Family History: Reviewed history from 02/02/2009 and no changes required. father DM, high chol,  HTN mother healthy PGF died of AMI at 42. only child  Social History: Reviewed history from 08/07/2007 and no changes required. 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  No kids. Never smoked. Occas. ETOH. Healthy diet. Used to be avid runner. Physical Exam General appearance: well developed, well nourished, no acute distress GU: No hernia felt, and inguinal canal feels possibly larger than normal?, no tenderness.  Penis and testicular exam is normal with no masses, tenderness, lesions.  Epidydimis feels normal. MSE: oriented to time, place, and person Assessment New Problems: ABD/PELVIC SWELLING MASS/LUMP OTH SPEC SITE (ICD-789.39)   Plan New Orders: T-Testicular imaging w/ vascular flow  [78761] Est. Patient Level III [16109] Planning Comments:   Most likely indirect hernia that he was able to self-reduce.  For precautions, will obtain a testicular ultrasound. Also to refer to a general surgeon to talk about management and potential surgical correction since he is young and likes to work out by [employer].  DDx includes neoplasm, swollen lymph node or gland, hydrocele, varicocele, direct hernia which should be able to be seen on ultrasound.  Avoid heavy lifting for now.   The patient and/or caregiver has been counseled thoroughly with regard to medications prescribed including dosage, schedule, interactions, rationale for use, and possible side effects and they verbalize understanding.  Diagnoses and expected course of recovery discussed and will return if not improved as expected or if the condition worsens. Patient and/or caregiver verbalized understanding.   Orders Added: 1)  T-Testicular imaging w/ vascular flow [78761] 2)  Est. Patient Level III [60454]

## 2011-01-03 NOTE — Telephone Encounter (Signed)
  Phone Note Outgoing Call Call back at [facility] Phone [phone]   Call placed by: Lajean Saver RN,  July 27, 2010 3:09 PM Call placed to: Patient Summary of Call: Callback: No answer. Call with questions or concerns

## 2011-02-22 ENCOUNTER — Encounter: Payer: Self-pay | Admitting: *Deleted

## 2011-02-22 ENCOUNTER — Emergency Department (INDEPENDENT_AMBULATORY_CARE_PROVIDER_SITE_OTHER)
Admission: EM | Admit: 2011-02-22 | Discharge: 2011-02-22 | Disposition: A | Discharge status: Home / Self Care | Payer: BC Managed Care – PPO | Source: Home / Self Care | Attending: Emergency Medicine | Admitting: Emergency Medicine

## 2011-02-22 DIAGNOSIS — R1030 Lower abdominal pain, unspecified: Secondary | ICD-10-CM

## 2011-02-22 DIAGNOSIS — R109 Unspecified abdominal pain: Secondary | ICD-10-CM

## 2011-02-22 NOTE — ED Notes (Signed)
Patient c/o RLQ/right groin pain. He was seen here and treated for the same symptoms 2-3 months ago and referred to general surgery. CT was done looking her hernia, CT negative. The pain resolved and returned 1 week ago. Denies N/V/D.

## 2011-02-22 NOTE — ED Provider Notes (Signed)
History     CSN: [id number]  Arrival date & time 02/22/11  8295   First MD Initiated Contact with Patient 02/22/11 [phone]      Chief Complaint  Patient presents with  . Abdominal Pain    (Consider location/radiation/quality/duration/timing/severity/associated sxs/prior treatment) HPI  This patient comes back for followup visit. Please refer to the last few notes to see the details. However in short, for the last few months he has had a right lower abdominal pain and groin pain. No nausea vomiting diarrhea or fever. He was referred to [facility] surgery where they did a CAT scan but did not find a hernia. He has also gone to see Dr. Darrick Penna in the past for this and was told that he had a conjoined tendon irritation. Physical  therapy did help him a little bit. The pain has since subsided mostly however a couple days ago he was having sexual intercourse and noticed a lump began on his right lower abdomen/groin. He is just here to ask what the next step would be indicated need to do anything down or he if he can continue to work out. Past Medical History  Diagnosis Date  . Inguinal hernia bilateral    Past Surgical History  Procedure Date  . Hernia repair 1990, 2002    [facility]  . Tonsillectomy     History reviewed. No pertinent family history.  History  Substance Use Topics  . Smoking status: Never Smoker   . Smokeless tobacco: Not on file  . Alcohol Use: Yes      Review of Systems  Allergies  Review of patient's allergies indicates no known allergies.  Home Medications   Current Outpatient Rx  Name Route Sig Dispense Refill  . MULTIVITAMINS PO CAPS Oral Take 1 capsule by mouth daily.        BP 134/89  Pulse 80  Temp(Src) 98.8 F (37.1 C) (Oral)  Resp 14  Ht 5\' 10"  (1.778 m)  Wt 167 lb (75.751 kg)  BMI 23.96 kg/m2  SpO2 99%  Physical Exam  Nursing note and vitals reviewed. Constitutional: He is oriented to person, place, and time. He appears well-developed  and well-nourished.  HENT:  Head: Normocephalic and atraumatic.  Eyes: No scleral icterus.  Neck: Neck supple.  Cardiovascular: Regular rhythm and normal heart sounds.   Pulmonary/Chest: Effort normal and breath sounds normal. No respiratory distress.  Genitourinary:       GU examination is normal. I do not feel any hernias. I cannot elicit any tenderness in the inguinal canal. No rashes or lesions.  Musculoskeletal:       Right hip examination demonstrates full range of motion flexion extension internal and external rotation FABER and FADIR tests are normal. Resisted motions are normal. He does have some tenderness on resisted biceps especially around the pubic ramus. Tenderness is mostly central. No swelling, lumps, bruising, lesions.  Neurological: He is alert and oriented to person, place, and time.  Skin: Skin is warm and dry.  Psychiatric: He has a normal mood and affect. His speech is normal.    ED Course  Procedures (including critical care time)  Labs Reviewed - No data to display No results found.   No diagnosis found.    MDM   After seeing the general surgeon and had a CAT scan, I do not believe that this is a hernia. I also do not believe this is related to his testicles. I do believe that this is some type of a  sports hernia or other groin tendinopathy. I suggested to him that he followup with Dr. Darrick Penna for this since he is likely the most educated in this field locally. At that time, it may be appropriate to get an MRI to look for sports hernia or other tendinopathy in the hip and groin. I also gave him the option to do physical therapy but he would like to hold off on for now. He will return to doing sports as normal. If he is having any further issues, swelling, increased pain or new problems, then he needs to make sure that he followup.     Lily Kocher, MD 02/22/11 1011

## 2011-03-03 ENCOUNTER — Encounter: Payer: Self-pay | Admitting: Family Medicine

## 2011-03-03 ENCOUNTER — Ambulatory Visit (INDEPENDENT_AMBULATORY_CARE_PROVIDER_SITE_OTHER): Payer: BC Managed Care – PPO | Admitting: Family Medicine

## 2011-03-03 VITALS — BP 133/87 | HR 76 | Temp 98.2°F | Ht 70.0 in | Wt 160.0 lb

## 2011-03-03 DIAGNOSIS — R1031 Right lower quadrant pain: Secondary | ICD-10-CM

## 2011-03-03 NOTE — Patient Instructions (Signed)
Your history and exam are most consistent with osteitis pubis vs a sports hernia. You do not have an inguinal hernia on exam. Your prior CT scan doesn't show any intraabdominal pathology to account for your pain or bulge. You do not have swollen lymph nodes on exam to suggest infection or malignancy. Restart physical therapy with both protocols and do this over next 6 weeks. Ice area as needed for pain. Ok to take tylenol and/or aleve as needed for pain also. Follow up with me in 6 weeks. If you're not improving, would consider MRI of your pelvis.

## 2011-03-04 ENCOUNTER — Encounter: Payer: Self-pay | Admitting: Family Medicine

## 2011-03-04 NOTE — Progress Notes (Signed)
Subjective:    Patient ID: Hayden Patrick, male    DOB: Mar 06, 1975, 36 y.o.   MRN: [id number]  PCP: None  HPI 36 yo M here for low abdominal/groin pain.  Patient states current issues date back to about 4-5 years ago. Without an acute injury he developed right lower quadrant/groin pain. Never extremely severe but enough to be irritating to him. He's previously had CT scan showing no evidence of intraabdominal abnormalities. Diagnosed with sports hernia and then seen by a general surgeon who felt this was not the case and did not recommend surgery. Had seen urologist who did not find cause for his pain. Also seen Dr. Darrick Penna - diagnosed with conjoint tendon strain/irritation - did well with physical therapy over 2 months but pain recurred. Pain is not always localized in a single area in right lower quadrant/groin. Is worse the more active he is. Then in past few weeks he has twice noticed (once after sex, once when getting up from couch) a very small bulge very low medial groin near pubic bone - was able to reduce this and it was not painful - found incidentally. Some nausea but otherwise no vomiting, diarrhea, hematemesis, hematochezia, other complaints. Denies penile discharge, dysuria, other GU symptoms. No back pain, numbness/tingling, incontinence.  No radiation into legs. 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 No specific motions make this worse - general movement seems to increase his pain.  Past Medical History  Diagnosis Date  . Inguinal hernia bilateral    Current Outpatient Prescriptions on File Prior to Visit  Medication Sig Dispense Refill  . Multiple Vitamin (MULTIVITAMIN) capsule Take 1 capsule by mouth daily.          Past Surgical History  Procedure Date  . Tonsillectomy   . Hernia repair 1990, 2002    [facility]    No Known Allergies  History   Social History  . Marital Status:  Divorced    Spouse Name: N/A    Number of Children: N/A  . Years of Education: N/A   Occupational History  . Not on file.   Social History Main Topics  . Smoking status: Never Smoker   . Smokeless tobacco: Not on file  . Alcohol Use: Yes  . Drug Use: No  . Sexually Active: Not on file   Other Topics Concern  . Not on file   Social History Narrative  . No narrative on file    Family History  Problem Relation Age of Onset  . Hypertension Father   . Hyperlipidemia Neg Hx   . Heart attack Neg Hx   . Diabetes Neg Hx   . Sudden death Neg Hx     BP 133/87  Pulse 76  Temp(Src) 98.2 F (36.8 C) (Oral)  Ht 5\' 10"  (1.778 m)  Wt 160 lb (72.576 kg)  BMI 22.96 kg/m2  Review of Systems See HPI above.    Objective:   Physical Exam Gen: NAD  Back: No gross deformity, scoliosis. No TTP.   FROM without pain. Strength LEs 5/5 all muscle groups - mild pain with resisted right hip adduction.   Negative SLRs. Poor hamstring flexibility. Sensation intact to light touch bilaterally. Negative logroll bilateral hips  GU: No penile discharge, palpable masses, hernias with valsalva within inguinal canal or on abdominal wall. No inguinal LAN. Mild TTP on pubic symphysis superiorly and inferiorly.  No other  TTP.    Assessment & Plan:  1. Right groin pain - reviewed prior records - CT is negative which is reassuring this is not an intraabdominal source unless this were a small hernia not visualized on the scan.  No inguinal lymphadenopathy.  Not very active past couple years to suggest pelvic stress fracture.  By exam he has signs of osteitis pubis though this certainly wouldn't cause a hernia but this may be an incidental finding.  Sports hernia would be another consideration with normal imaging.  He did well previously with physical therapy and had not regularly been doing home exercises - advised him we should restart this with osteitis pubis and sports hernia protocols over next 1-2  months.  Ice as needed.  F/u in 6 weeks.  If not improving would consider pelvic MRI.

## 2011-03-04 NOTE — Assessment & Plan Note (Signed)
reviewed prior records - CT is negative which is reassuring this is not an intraabdominal source unless this were a small hernia not visualized on the scan.  No inguinal lymphadenopathy.  Not very active past couple years to suggest pelvic stress fracture.  By exam he has signs of osteitis pubis though this certainly wouldn't cause a hernia but this may be an incidental finding.  Sports hernia would be another consideration with normal imaging.  He did well previously with physical therapy and had not regularly been doing home exercises - advised him we should restart this with osteitis pubis and sports hernia protocols over next 1-2 months.  Ice as needed.  F/u in 6 weeks.  If not improving would consider pelvic MRI.

## 2011-03-09 ENCOUNTER — Ambulatory Visit: Payer: BC Managed Care – PPO | Admitting: Sports Medicine

## 2011-07-08 ENCOUNTER — Encounter: Payer: Self-pay | Admitting: *Deleted

## 2011-07-08 ENCOUNTER — Emergency Department
Admission: EM | Admit: 2011-07-08 | Discharge: 2011-07-08 | Disposition: A | Discharge status: Home / Self Care | Payer: BC Managed Care – PPO | Source: Home / Self Care | Attending: Emergency Medicine | Admitting: Emergency Medicine

## 2011-07-08 DIAGNOSIS — R1031 Right lower quadrant pain: Secondary | ICD-10-CM

## 2011-07-08 NOTE — ED Provider Notes (Signed)
History     CSN: [id number]  Arrival date & time 07/08/11  4098   None     Chief Complaint  Patient presents with  . Hip Pain  . Groin Pain    (Consider location/radiation/quality/duration/timing/severity/associated sxs/prior treatment) HPI   This patient has a multiple year history of right groin pain, no acute injury.  He has previously had a CAT scan showing no evidence of any intra-abdominal pathology.  When he was diagnosed with a sports hernia and seen by a general surgeon who did not agree with this.  He has also seen a urologist who did not find anything.  He then saw Dr. Darrick Penna who diagnosed with the conjoined tendon strain and did well with physical therapy but the pain recurred.  Some nausea but otherwise no V, D, blood in stool, GU symptoms.  No back pain, numbness/tingling, incontinence. No radiation.  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  He was helping a friend move recently and felt pain again.  He is mostly concerned because this is affecting his cardiovascular and running workout and he would like to get back into being able to run on a normal basis.  He has also been doing some hiking over the last few weeks which have been aggravating the problem as well.  Past Medical History  Diagnosis Date  . Inguinal hernia bilateral    Past Surgical History  Procedure Date  . Tonsillectomy   . Hernia repair 1990, 2002    [facility]    Family History  Problem Relation Age of Onset  . Hypertension Father   . Hyperlipidemia Neg Hx   . Heart attack Neg Hx   . Diabetes Neg Hx   . Sudden death Neg Hx     History  Substance Use Topics  . Smoking status: Never Smoker   . Smokeless tobacco: Not on file  . Alcohol Use: Yes      Review of Systems  All other systems reviewed and are negative.    Allergies  Review of patient's allergies indicates no known allergies.  Home Medications   Current Outpatient Rx  Name Route Sig  Dispense Refill  . MULTIVITAMINS PO CAPS Oral Take 1 capsule by mouth daily.        BP 132/85  Pulse 66  Temp(Src) 98.7 F (37.1 C) (Oral)  Resp 16  Ht 5\' 11"  (1.803 m)  Wt 167 lb (75.751 kg)  BMI 23.29 kg/m2  SpO2 100%  Physical Exam  Nursing note and vitals reviewed. Constitutional: He is oriented to person, place, and time. He appears well-developed and well-nourished.  HENT:  Head: Normocephalic and atraumatic.  Eyes: No scleral icterus.  Neck: Neck supple.  Cardiovascular: Regular rhythm and normal heart sounds.   Pulmonary/Chest: Effort normal and breath sounds normal. No respiratory distress.  Musculoskeletal:       Back with no deformity, scoliosis, TTP.  FROM without pain.  Strength LEs 5/5 all muscle groups - mild pain with resisted right hip adduction.  Negative SLRs.  Negative logroll bilateral hips.  No masses, hernias with valsalva within inguinal canal or on abdominal wall. No inguinal LAN.  Mild TTP on pubic symphysis.  No other TTP.   Neurological: He is alert and oriented to person, place, and time.  Skin: Skin is warm and dry.  Psychiatric: He has a normal mood and affect. His speech is normal.    ED Course  Procedures (including  critical care time)  Labs Reviewed - No data to display No results found.   1. Right groin pain       MDM   I do not believe this is intra-abdominal process or inguinal hernia or a urologic problem.  I feel this is most likely osteitis pubis versus a sports hernia versus conjoined tendon problem versus abductor muscle problem.  I would like him to restart his home physical therapy regimen and take Aleve as needed.  I would also like him to use ice daily.  If he is not improving within a few weeks, I would like him to return to Dr. Pearletha Forge who can consider either doing an ultrasound of the area versus ordering an MRI which should give him definitive answers.  The patient understands and agrees to this course of  action.    Marlaine Hind, MD 07/08/11 [phone]

## 2011-07-08 NOTE — ED Notes (Signed)
Pt c/o RT hip/ groin pain x 1 wk. No OTC meds.

## 2012-07-20 ENCOUNTER — Encounter: Payer: Self-pay | Admitting: *Deleted

## 2012-07-20 ENCOUNTER — Emergency Department
Admission: EM | Admit: 2012-07-20 | Discharge: 2012-07-20 | Disposition: A | Discharge status: Home / Self Care | Payer: BC Managed Care – PPO | Source: Home / Self Care | Attending: Family Medicine | Admitting: Family Medicine

## 2012-07-20 DIAGNOSIS — S76219A Strain of adductor muscle, fascia and tendon of unspecified thigh, initial encounter: Secondary | ICD-10-CM

## 2012-07-20 DIAGNOSIS — IMO0002 Reserved for concepts with insufficient information to code with codable children: Secondary | ICD-10-CM

## 2012-07-20 NOTE — ED Provider Notes (Signed)
History     CSN: [id number]  Arrival date & time 07/20/12  1009   First MD Initiated Contact with Patient 07/20/12 1026      Chief Complaint  Patient presents with  . Groin Pain    right   HPI Right groin pain x2 days. Patient states that he was in the bed and try to jump up to chase his daughter and noticed right medial thigh pain. Pain is been fairly constant this point. Pain is worse with external rotation of the hip as well as leg adduction. Has had left hernias on the left side x2 status post repair. Patient denies any distal numbness or paresthesias. No urinary complaints. No recent infections. No fevers or chills.   Past Medical History  Diagnosis Date  . Inguinal hernia bilateral    Past Surgical History  Procedure Laterality Date  . Tonsillectomy    . Hernia repair  1990, 2002    [facility]    Family History  Problem Relation Age of Onset  . Hypertension Father   . Hyperlipidemia Neg Hx   . Heart attack Neg Hx   . Diabetes Neg Hx   . Sudden death Neg Hx     History  Substance Use Topics  . Smoking status: Never Smoker   . Smokeless tobacco: Never Used  . Alcohol Use: Yes      Review of Systems  All other systems reviewed and are negative.    Allergies  Review of patient's allergies indicates no known allergies.  Home Medications   Current Outpatient Rx  Name  Route  Sig  Dispense  Refill  . Multiple Vitamin (MULTIVITAMIN) capsule   Oral   Take 1 capsule by mouth daily.             BP 133/90  Pulse 95  Temp(Src) 98.4 F (36.9 C) (Oral)  Resp 14  Ht 5\' 11"  (1.803 m)  Wt 172 lb (78.019 kg)  BMI 24 kg/m2  SpO2 99%  Physical Exam  Constitutional: He appears well-developed and well-nourished.  HENT:  Head: Normocephalic and atraumatic.  Eyes: Conjunctivae are normal. Pupils are equal, round, and reactive to light.  Neck: Normal range of motion. Neck supple.  Cardiovascular: Normal rate, regular rhythm and normal heart sounds.     Pulmonary/Chest: Effort normal and breath sounds normal.  Abdominal: Soft.  Genitourinary:  No hernias present   Musculoskeletal:       Legs: Neurological: He is alert.  Skin: Skin is warm.    ED Course  Procedures (including critical care time)  Labs Reviewed - No data to display No results found.   1. Groin strain, initial encounter       MDM  Exam most consistent with groin strain. No clinical signs of urinary involvement. No hernias on exam. Rice and NSAIDs. Discussed general care and [facility] red flags. Followup with sports medicine if symptoms not improve.    The patient and/or caregiver has been counseled thoroughly with regard to treatment plan and/or medications prescribed including dosage, schedule, interactions, rationale for use, and possible side effects and they verbalize understanding. Diagnoses and expected course of recovery discussed and will return if not improved as expected or if the condition worsens. Patient and/or caregiver verbalized understanding.             Doree Albee, MD 07/20/12 [phone]

## 2012-07-20 NOTE — ED Notes (Signed)
Hayden Patrick reports feeling a pain when jumping our of bed last night in his right groin. Pain is present with movement. Denies any urinary abnormalities or hematuria. Hx of left inguinal hernia.

## 2012-08-06 ENCOUNTER — Encounter: Payer: Self-pay | Admitting: *Deleted

## 2012-08-06 ENCOUNTER — Emergency Department
Admission: EM | Admit: 2012-08-06 | Discharge: 2012-08-06 | Disposition: A | Discharge status: Home / Self Care | Payer: BC Managed Care – PPO | Source: Home / Self Care | Attending: Family Medicine | Admitting: Family Medicine

## 2012-08-06 ENCOUNTER — Ambulatory Visit (INDEPENDENT_AMBULATORY_CARE_PROVIDER_SITE_OTHER): Payer: BC Managed Care – PPO | Admitting: Sports Medicine

## 2012-08-06 DIAGNOSIS — M25559 Pain in unspecified hip: Secondary | ICD-10-CM

## 2012-08-06 DIAGNOSIS — M25551 Pain in right hip: Secondary | ICD-10-CM

## 2012-08-06 DIAGNOSIS — R1031 Right lower quadrant pain: Secondary | ICD-10-CM

## 2012-08-06 NOTE — Progress Notes (Signed)
   Subjective:    I'm seeing this patient as a consultation for:  Dr. Alvester Morin  CC: Right groin pain  HPI: This is a very pleasant 37 year old male who has a long history of decades of pain in his right groin, he saw Dr. Pearletha Forge with sports medicine in [facility], went through formal physical therapy, and was undergoing a workup for sports hernia.  He had a CT scan of his belly that was negative. The usual course is he'll develop pain from week or 2, work extensively with his physical therapist, and the pain will resolve for about a year. He's never had dedicated imaging, and denies any trauma. Pain is localized over the medial and anterior groin, worse with hip flexion. He denies any mechanical symptoms, pain is moderate and persistent.  Past medical history, Surgical history, Family history not pertinant except as noted below, Social history, Allergies, and medications have been entered into the medical record, reviewed, and no changes needed.   Review of Systems: No headache, visual changes, nausea, vomiting, diarrhea, constipation, dizziness, abdominal pain, skin rash, fevers, chills, night sweats, weight loss, swollen lymph nodes, body aches, joint swelling, muscle aches, chest pain, shortness of breath, mood changes, visual or auditory hallucinations.   Objective:   General: Well Developed, well nourished, and in no acute distress.  Neuro/Psych: Alert and oriented x3, extra-ocular muscles intact, able to move all 4 extremities, sensation grossly intact. Skin: Warm and dry, no rashes noted.  Respiratory: Not using accessory muscles, speaking in full sentences, trachea midline.  Cardiovascular: Pulses palpable, no extremity edema. Abdomen: Does not appear distended. Right Hip: ROM IR: 45 Deg, ER: 45 Deg, Flexion: 120 Deg, Extension: 100 Deg, Abduction: 45 Deg, Adduction: 45 Deg Strength IR: 5/5, ER: 5/5, Flexion: 4/5, Extension: 5/5, Abduction: 5/5, Adduction: 5/5 Resisted hip flexion  causes pain. Pelvic alignment unremarkable to inspection and palpation. Standing hip rotation and gait without trendelenburg sign / unsteadiness. Greater trochanter without tenderness to palpation. No tenderness over piriformis and greater trochanter. No pain with FABER or FADIR. No SI joint tenderness and normal minimal SI movement.  Impression and Recommendations:   This case required medical decision making of moderate complexity.

## 2012-08-06 NOTE — Assessment & Plan Note (Signed)
[place] has had a fairly extensive workup including a negative CT scan for hernia. Formal physical therapy. He's never had an MRI or MR arthrogram of his right hip. His symptoms are typically transient, lasting approximately 2 weeks, then he gets one year of relief. We will continue conservative measures with hip flexor rehabilitation which she will work with his formal physical therapist, as well as 2 Aleve twice a day for 2 weeks. He'll see me back in a couple weeks, he does desire to establish care with [place]. Certainly we need to keep hip labral pathology in the back of our heads, if this does not resolve we can pursue an MR arthrogram of his right hip.

## 2012-08-06 NOTE — ED Notes (Signed)
The pt is here today for a follow up of his RT hip and groin pain. He has an appt with PT tomorrow.

## 2012-08-06 NOTE — ED Provider Notes (Signed)
   History    CSN: [id number] Arrival date & time 08/06/12  0819  First MD Initiated Contact with Patient 08/06/12 [phone]     Chief Complaint  Patient presents with  . Hip Pain  . Groin Pain   HPI  Pt presents today for follow up of hip/groin pain  Pt was seen on 6/20 for similar sxs.  Was dxd with groin strain  Has had multiple flares of this in the past.  Is fairly active. Does hiking on a regular basis.  Pain has still persisted and has now seemed to lateral buttock.  No numbness or paresthesias.  No known trauma.  Has been somewhat inactive in comparison to usual routine.  Prior hx/o inguinal hernias.  Has not had recurrence for several years.  Sxs do not feel like previous hernia strain.    Past Medical History  Diagnosis Date  . Inguinal hernia bilateral   Past Surgical History  Procedure Laterality Date  . Tonsillectomy    . Hernia repair  1990, 2002    [facility]   Family History  Problem Relation Age of Onset  . Hypertension Father   . Hyperlipidemia Neg Hx   . Heart attack Neg Hx   . Diabetes Neg Hx   . Sudden death Neg Hx    History  Substance Use Topics  . Smoking status: Never Smoker   . Smokeless tobacco: Never Used  . Alcohol Use: Yes    Review of Systems  All other systems reviewed and are negative.    Allergies  Review of patient's allergies indicates no known allergies.  Home Medications   Current Outpatient Rx  Name  Route  Sig  Dispense  Refill  . Multiple Vitamin (MULTIVITAMIN) capsule   Oral   Take 1 capsule by mouth daily.            BP 137/90  Pulse 90  Temp(Src) 98.4 F (36.9 C) (Oral)  Resp 18  Wt 173 lb (78.472 kg)  BMI 24.14 kg/m2  SpO2 100% Physical Exam  Constitutional: He appears well-developed and well-nourished.  HENT:  Head: Normocephalic and atraumatic.  Eyes: Conjunctivae are normal. Pupils are equal, round, and reactive to light.  Neck: Normal range of motion.  Cardiovascular: Normal rate and regular  rhythm.   Pulmonary/Chest: Effort normal.  Abdominal: Soft.  Genitourinary:  No inguinal hernias on exam.    Musculoskeletal:  Hip full ROM  No pain with internal rotation Mild pain with hip flexion Weak resisted hip abduction  Neurovascularly intact distally   Neurological: He is alert.  Skin: Skin is warm.    ED Course  Procedures (including critical care time) Labs Reviewed - No data to display No results found. 1. Hip pain, right     MDM  Subacute on chronic issue.  Suspect this mild recurrence of sxs.  Will consult sports medicine as this has been a recurrent issue for many years. Treatment and follow up per sports medicine.     The patient and/or caregiver has been counseled thoroughly with regard to treatment plan and/or medications prescribed including dosage, schedule, interactions, rationale for use, and possible side effects and they verbalize understanding. Diagnoses and expected course of recovery discussed and will return if not improved as expected or if the condition worsens. Patient and/or caregiver verbalized understanding.         Doree Albee, MD 08/29/12 [phone]

## 2012-12-31 ENCOUNTER — Encounter: Payer: Self-pay | Admitting: Emergency Medicine

## 2012-12-31 ENCOUNTER — Emergency Department
Admission: EM | Admit: 2012-12-31 | Discharge: 2012-12-31 | Disposition: A | Discharge status: Home / Self Care | Payer: BC Managed Care – PPO | Source: Home / Self Care | Attending: Family Medicine | Admitting: Family Medicine

## 2012-12-31 DIAGNOSIS — R1905 Periumbilic swelling, mass or lump: Secondary | ICD-10-CM

## 2012-12-31 NOTE — ED Provider Notes (Signed)
CSN: [id number]     Arrival date & time 12/31/12  0825 History   First MD Initiated Contact with Patient 12/31/12 [phone]     Chief Complaint  Patient presents with  . epigastric problem       HPI Comments: For the past 4 days when doing abdominal crunches (without weights) he has noticed swelling in the midline above his umbilicus.  There is no pain or discomfort, and the bulging only appears with flexion of his abdominal muscles.  He does not know how long the condition has existed because he has not paid attention to his abdomen in the past.  He has had no abdominal surgery.  The history is provided by the patient.    Past Medical History  Diagnosis Date  . Inguinal hernia bilateral   Past Surgical History  Procedure Laterality Date  . Tonsillectomy    . Hernia repair  1990, 2002    [facility]   Family History  Problem Relation Age of Onset  . Hypertension Father   . Hyperlipidemia Neg Hx   . Heart attack Neg Hx   . Diabetes Neg Hx   . Sudden death Neg Hx    History  Substance Use Topics  . Smoking status: Never Smoker   . Smokeless tobacco: Never Used  . Alcohol Use: Yes    Review of Systems  Constitutional: Negative for fever, activity change, appetite change and unexpected weight change.  HENT: Negative.   Eyes: Negative.   Respiratory: Negative.   Cardiovascular: Negative.   Gastrointestinal: Negative for nausea, vomiting, abdominal pain, diarrhea, constipation and abdominal distention.  Genitourinary: Negative.   Musculoskeletal: Negative for myalgias.  Skin: Negative.   Neurological: Negative for weakness.  All other systems reviewed and are negative.    Allergies  Review of patient's allergies indicates no known allergies.  Home Medications   Current Outpatient Rx  Name  Route  Sig  Dispense  Refill  . Multiple Vitamin (MULTIVITAMIN) capsule   Oral   Take 1 capsule by mouth daily.            BP 141/90  Pulse 98  Temp(Src) 98.1 F (36.7 C) (Oral)   Ht 5\' 11"  (1.803 m)  Wt 181 lb (82.101 kg)  BMI 25.26 kg/m2  SpO2 100% Physical Exam  Nursing note and vitals reviewed. Constitutional: He is oriented to person, place, and time. He appears well-developed and well-nourished. No distress.  HENT:  Head: Normocephalic.  Mouth/Throat: Oropharynx is clear and moist.  Eyes: Conjunctivae are normal. Pupils are equal, round, and reactive to light.  Cardiovascular: Normal heart sounds.   Pulmonary/Chest: Breath sounds normal.  Abdominal: Soft. Bowel sounds are normal. He exhibits no distension and no mass. There is no splenomegaly or hepatomegaly. There is no tenderness. There is no guarding and no CVA tenderness. Hernia confirmed negative in the ventral area.    With valsalva and flexion of the abdominus rectus muscles there is a nontender bulging in the midline as noted on diagram.  No tenderness or defect is present on deep palpation at this location while abdominal muscles are relaxed.  Musculoskeletal: He exhibits no edema.  Neurological: He is alert and oriented to person, place, and time.  Skin: Skin is warm and dry. No rash noted.    ED Course  Procedures  none       MDM   1. Abdominal or pelvic swelling, mass or lump, periumbilic    Patient does appear to have a weak area  between the recti abdomini muscles above the umbilicus, but does not have a ventral hernia.  Recommend that he avoid abdominal crunches and focus on development of core strengthening exercises such as planks. If swelling increases with valsalva and/or pain develops recommend follow-up with PCP for possible abdominal U/S or CT.    Lattie Haw, MD 12/31/12 [phone]

## 2012-12-31 NOTE — ED Notes (Signed)
For the last two days when moving certain ways he can see a muscle in his epigastric region, denies pain, discomfort.

## 2012-12-31 NOTE — Discharge Instructions (Signed)
Limit abdominal crunches with heavy weights.

## 2013-02-06 ENCOUNTER — Encounter: Payer: Self-pay | Admitting: Family Medicine

## 2013-02-06 ENCOUNTER — Ambulatory Visit (INDEPENDENT_AMBULATORY_CARE_PROVIDER_SITE_OTHER): Payer: BC Managed Care – PPO | Admitting: Family Medicine

## 2013-02-06 VITALS — BP 139/94 | HR 74 | Wt 181.0 lb

## 2013-02-06 DIAGNOSIS — K439 Ventral hernia without obstruction or gangrene: Secondary | ICD-10-CM

## 2013-02-06 DIAGNOSIS — Z23 Encounter for immunization: Secondary | ICD-10-CM

## 2013-02-06 DIAGNOSIS — Z131 Encounter for screening for diabetes mellitus: Secondary | ICD-10-CM

## 2013-02-06 DIAGNOSIS — J069 Acute upper respiratory infection, unspecified: Secondary | ICD-10-CM

## 2013-02-06 DIAGNOSIS — Z13220 Encounter for screening for lipoid disorders: Secondary | ICD-10-CM

## 2013-02-06 DIAGNOSIS — R030 Elevated blood-pressure reading, without diagnosis of hypertension: Secondary | ICD-10-CM

## 2013-02-06 DIAGNOSIS — Z283 Underimmunization status: Secondary | ICD-10-CM

## 2013-02-06 DIAGNOSIS — Z2839 Other underimmunization status: Secondary | ICD-10-CM

## 2013-02-06 LAB — BASIC METABOLIC PANEL WITH GFR
BUN: 14 mg/dL (ref 6–23)
CO2: 29 mEq/L (ref 19–32)
Calcium: 9.5 mg/dL (ref 8.4–10.5)
Chloride: 104 mEq/L (ref 96–112)
Creat: 0.93 mg/dL (ref 0.50–1.35)
GFR, Est African American: >89 mL/min
GFR, Est Non African American: >89 mL/min
Glucose, Bld: 92 mg/dL (ref 70–99)
Potassium: 4 mEq/L (ref 3.5–5.3)
Sodium: 141 mEq/L (ref 135–145)

## 2013-02-06 LAB — LIPID PANEL
CHOLESTEROL: 201 mg/dL — AB (ref 0–200)
HDL: 32 mg/dL — ABNORMAL LOW (ref >39)
LDL CALC: 111 mg/dL — AB (ref 0–99)
Total CHOL/HDL Ratio: 6.3 Ratio
Triglycerides: 290 mg/dL — ABNORMAL HIGH (ref <150)
VLDL: 58 mg/dL — AB (ref 0–40)

## 2013-02-06 NOTE — Progress Notes (Signed)
CC: Hayden Patrick is a 38 y.o. male is here for abdminal issues   Subjective: HPI:  Shares happiness that his son will be born in March he believes he has had tetanus booster in the last 5 years but he is unsure about the whooping cough  Patient complains of 3-6 months of abdominal swelling is localized in the midline of his abdomen just below the sternum approximately 3 inches extending inferiorly. It is present only with Valsalva, abdominal straining, or doing crunches. It has not been getting better or worse since onset. It is painless. Is present on a daily basis if he does any of the above. It does not relate any dietary habits. Has never been associated with nausea, vomiting, abdominal pain, diarrhea, nor constipation.  Denies recent injury or surgery to the site of his concern. Denies any overlying skin changes. Can occur anytime of the day.  He has a history of a left inguinal hernia repaired both indirectly and directly years ago. He denies abdominal pain whatsoever today  He questions if his blood pressure is elevated to the point were he should be concerned. He reports his blood pressures always been somewhat of 120/80 but never above 140/90 that he knows of. He is a family history of hypertension in his father side. He usually works out most days of the week of strength training however has avoided this over the past month because of the presenting chief complaint. Denies chest pain, shortness of breath, orthopnea peripheral edema nor motor or sensory disturbances  He complains that for the past 2 days she's had a stuffy nose and a nonproductive cough. He thinks it might be getting better since onset it came on suddenly. He's has not been taking any medications. He denies fevers, chills or shortness of breath   Review Of Systems Outlined In HPI  Past Medical History  Diagnosis Date  . Inguinal hernia bilateral  . DEPRESSION 08/16/2007    Qualifier: Diagnosis of  By: Hayden Patrick        Family History  Problem Relation Age of Onset  . Hypertension Father   . Hyperlipidemia Neg Hx   . Heart attack Neg Hx   . Diabetes Neg Hx   . Sudden death Neg Hx      History  Substance Use Topics  . Smoking status: Never Smoker   . Smokeless tobacco: Never Used  . Alcohol Use: Yes     Objective: Filed Vitals:   02/06/13 0926  BP: 139/94  Pulse: 74    General: Alert and Oriented, No Acute Distress HEENT: Pupils equal, round, reactive to light. Conjunctivae clear.  External ears unremarkable, canals clear with intact TMs with appropriate landmarks.  Middle ear appears open without effusion. Pink inferior turbinates.  Moist mucous membranes, pharynx without inflammation nor lesions.  Neck supple without palpable lymphadenopathy nor abnormal masses. Lungs: Clear to auscultation bilaterally, no wheezing/ronchi/rales.  Comfortable work of breathing. Good air movement. Cardiac: Regular rate and rhythm. Normal S1/S2.  No murmurs, rubs, nor gallops.   Abdomen: Normal bowel sounds, soft and non tender without palpable masses other than a 4 fingerbreadth long 2 finger breadth wide ventral hernia 1-2 inches below the manubrium which is painless and only present with Valsalva Extremities: No peripheral edema.  Strong peripheral pulses.  Mental Status: No depression, anxiety, nor agitation. Skin: Warm and dry.  Assessment & Plan: Hayden was seen today for abdminal issues.  Diagnoses and associated orders for this visit:  Ventral  hernia  Lipid screening - Lipid panel  Diabetes mellitus screening - BASIC METABOLIC PANEL WITH GFR  Prehypertension - BASIC METABOLIC PANEL WITH GFR  Viral URI  Immunization deficiency - Tdap vaccine greater than or equal to 7yo IM    Ventral hernia: Reassurance provided I think he can go back into moderate to light strength training as long as he does not exert himself with straining. I believe we can follow this clinically since it is  asymptomatic but I did go over some core exercises that he can engage in. He is overdue for dyslipidemia screen and diabetic screening Pre-hypertension: Discussed diet and exercise interventions to keep blood pressure and check most notably sodium awareness and restriction Viral URI: Reassurance provided that his upper respiratory symptoms should resolve within a week Our chart states that he did receive pertussis vaccine within the last 3 years however he's pretty sure this is just tetanus alone and not pertussis, for sake of safety he will receive tdap again today  25 minutes spent face-to-face during visit today of which at least 50% was counseling or coordinating care regarding: 1. Ventral hernia   2. Lipid screening   3. Diabetes mellitus screening   4. Prehypertension   5. Viral URI   6. Immunization deficiency      Return if symptoms worsen or fail to improve.

## 2013-02-07 ENCOUNTER — Encounter: Payer: Self-pay | Admitting: Family Medicine

## 2013-02-07 DIAGNOSIS — E785 Hyperlipidemia, unspecified: Secondary | ICD-10-CM | POA: Insufficient documentation

## 2013-03-20 ENCOUNTER — Encounter: Payer: Self-pay | Admitting: Emergency Medicine

## 2013-03-20 ENCOUNTER — Emergency Department
Admission: EM | Admit: 2013-03-20 | Discharge: 2013-03-20 | Disposition: A | Discharge status: Home / Self Care | Payer: BC Managed Care – PPO | Source: Home / Self Care | Attending: Family Medicine | Admitting: Family Medicine

## 2013-03-20 DIAGNOSIS — R1032 Left lower quadrant pain: Secondary | ICD-10-CM

## 2013-03-20 DIAGNOSIS — R109 Unspecified abdominal pain: Secondary | ICD-10-CM

## 2013-03-20 NOTE — ED Provider Notes (Signed)
CSN: [id number]     Arrival date & time 03/20/13  0919 History   First MD Initiated Contact with Patient 03/20/13 1010     Chief Complaint  Patient presents with  . Groin Pain        HPI Comments: Patient has a past surgical history of left hernia repair (both direct and indirect) in 2002, which later recurred requiring revision in 2007.  He has occasional recurrent pain in his left inguinal area.  Two years ago he had an episode of recurrent pain and his surgeon sent him for a CT scan that showed no recurrence. He now has had recurrent similar left groin pain for two weeks, and he wishes to be reassured that there is not a recurrent hernia.  He feels well otherwise.  No fevers, chills, and sweats.  No nausea/vomiting.  No abdominal pain.  No urinary symptoms.  Patient is a 38 y.o. male presenting with groin pain. The history is provided by the patient.  Groin Pain This is a recurrent problem. Episode onset: 2 weeks. The problem occurs constantly. The problem has not changed since onset.Pertinent negatives include no abdominal pain. Nothing aggravates the symptoms. Nothing relieves the symptoms. He has tried nothing for the symptoms.    Past Medical History  Diagnosis Date  . Inguinal hernia bilateral  . DEPRESSION 08/16/2007    Qualifier: Diagnosis of  By: Thomos LemonsBowen DO, Karen     Past Surgical History  Procedure Laterality Date  . Tonsillectomy    . Hernia repair  1990, [facility]  . Left inguinal hernia x 2     Family History  Problem Relation Age of Onset  . Hypertension Father   . Heart failure Father   . Hyperlipidemia Father   . Heart attack Neg Hx   . Diabetes Neg Hx   . Sudden death Neg Hx    History  Substance Use Topics  . Smoking status: Never Smoker   . Smokeless tobacco: Never Used  . Alcohol Use: Yes    Review of Systems  Gastrointestinal: Negative for abdominal pain.  All other systems reviewed and are negative.      Allergies  Review of patient's  allergies indicates no known allergies.  Home Medications  No current outpatient prescriptions on file. BP 132/88  Pulse 93  Temp(Src) 98.2 F (36.8 C) (Oral)  Resp 18  Ht 5\' 11"  (1.803 m)  Wt 178 lb (80.74 kg)  BMI 24.84 kg/m2  SpO2 100% Physical Exam Nursing notes and Vital Signs reviewed. Appearance:  Patient appears healthy, stated age, and in no acute distress Eyes:  Pupils are equal, round, and reactive to light and accomodation.  Extraocular movement is intact.  Conjunctivae are not inflamed  Pharynx:  Normal Neck:  Supple.   No adenopathy Lungs:  Clear to auscultation.  Breath sounds are equal.  Heart:  Regular rate and rhythm without murmurs, rubs, or gallops.  Abdomen:  Nontender without masses or hepatosplenomegaly.  Bowel sounds are present.  No CVA or flank tenderness.  Extremities:  No edema.  No calf tenderness Skin:  No rash present.  Genitourinary:  Penis normal without lesions or urethral discharge.  Scrotum is normal.  Testes are descended bilaterally without nodules or tenderness.  No hernias are palpated.  No regional lymphadenopathy palpated   ED Course  Procedures  none       MDM   Final diagnoses:  Left inguinal pain    No evidence recurrent hernia  today. Take Ibuprofen 200mg , 4 tabs every 8 hours with food for about 5 days. Followup with Dr. Rodney [facility] ([facility]) if not improving about two weeks or if symptoms worsen.     Lattie Haw, MD 03/22/13 [id number]

## 2013-03-20 NOTE — Discharge Instructions (Signed)
Take Ibuprofen 200mg , 4 tabs every 8 hours with food for about 5 days.

## 2013-03-20 NOTE — ED Notes (Signed)
Pt c/o LT hip/groin pain x 2 wks. He reports a hx of inguinal hernia surgery x 2 on that side.

## 2013-12-30 ENCOUNTER — Ambulatory Visit (INDEPENDENT_AMBULATORY_CARE_PROVIDER_SITE_OTHER): Payer: BC Managed Care – PPO | Admitting: Family Medicine

## 2013-12-30 ENCOUNTER — Encounter: Payer: Self-pay | Admitting: Family Medicine

## 2013-12-30 VITALS — BP 169/102 | HR 93 | Wt 185.0 lb

## 2013-12-30 DIAGNOSIS — A499 Bacterial infection, unspecified: Secondary | ICD-10-CM | POA: Diagnosis not present

## 2013-12-30 DIAGNOSIS — K439 Ventral hernia without obstruction or gangrene: Secondary | ICD-10-CM | POA: Diagnosis not present

## 2013-12-30 DIAGNOSIS — B9689 Other specified bacterial agents as the cause of diseases classified elsewhere: Secondary | ICD-10-CM

## 2013-12-30 DIAGNOSIS — J329 Chronic sinusitis, unspecified: Secondary | ICD-10-CM

## 2013-12-30 MED ORDER — AMOXICILLIN-POT CLAVULANATE 500-125 MG PO TABS: ORAL_TABLET | ORAL | Status: AC

## 2013-12-30 NOTE — Progress Notes (Signed)
CC: Hayden Patrick is a 38 y.o. male is here for Sinusitis   Subjective: HPI:   Complains of nasal congestion and postnasal drip that has been present for 2 weeks it was slowly improving last week and then took a turn for the worse over the weekend. It is now moderate in severity accompanied by facial pressure in both cheeks that radiates into the molars of both teeth. Accompanied by nonproductive cough but no fevers, chills, or confusion. Denies sore throat or shortness of breath. Symptoms are improved with Mucinex but only for 2-3 hours.  With his cough he's had some generalized abdominal discomfort and would like to be checked to see if he has a worsening hernia. He also has mild pain in the right lower quadrant with some mild right inguinal pain with coughing. Denies constipation diarrhea nor any other genitourinary or GI complaints.  Review Of Systems Outlined In HPI  Past Medical History  Diagnosis Date  . Inguinal hernia bilateral  . DEPRESSION 08/16/2007    Qualifier: Diagnosis of  By: Thomos LemonsBowen DO, Karen      Past Surgical History  Procedure Laterality Date  . Tonsillectomy    . [facility]  [facility]  . Left inguinal hernia x 2     Family History  Problem Relation Age of Onset  . Hypertension Father   . Heart failure Father   . Hyperlipidemia Father   . Heart attack Neg Hx   . Diabetes Neg Hx   . Sudden death Neg Hx     History   Social History  . Marital Status: Divorced    Spouse Name: N/A    Number of Children: N/A  . Years of Education: N/A   Occupational History  . Not on file.   Social History Main Topics  . Smoking status: Never Smoker   . Smokeless tobacco: Never Used  . Alcohol Use: Yes  . Drug Use: No  . Sexual Activity: Not on file   Other Topics Concern  . Not on file   Social History Narrative     Objective: BP 169/102 mmHg  Pulse 93  Wt 185 lb (83.915 kg)  General: Alert and Oriented, No Acute Distress HEENT: Pupils equal,  round, reactive to light. Conjunctivae clear.  External ears unremarkable, canals clear with intact TMs with appropriate landmarks.  Middle ear appears open without effusion. Pink inferior turbinates.  Moist mucous membranes, pharynx without inflammation nor lesions however moderate cobblestoning and postnasal drip.  Neck supple without palpable lymphadenopathy nor abnormal masses. Lungs: Clear to auscultation bilaterally, no wheezing/ronchi/rales.  Comfortable work of breathing. Good air movement. Abdomen: Normal bowel sounds, soft and non tender. Ventral hernia approximately 4 finger breaths in length and 2 breaths in width just below the manubrium. No right or left inguinal hernia. Extremities: No peripheral edema.  Strong peripheral pulses.  Mental Status: No depression, anxiety, nor agitation. Skin: Warm and dry.  Assessment & Plan: Hayden was seen today for sinusitis.  Diagnoses and associated orders for this visit:  Bacterial sinusitis - amoxicillin-clavulanate (AUGMENTIN) 500-125 MG per tablet; Take one by mouth every 8 hours for ten total days.  Ventral hernia without obstruction or gangrene    bacterial sinusitis: Start Augmentin consider nasal saline washes avoid decongestants Ventral hernia: Reassurance provided that this is the same size as it was when I saw him last, his abdominal pain is felt to be only due to frequent coughing and muscle soreness.  25  minutes spent face-to-face during visit today of which at least 50% was counseling or coordinating care regarding: 1. Bacterial sinusitis   2. Ventral hernia without obstruction or gangrene       Return if symptoms worsen or fail to improve.

## 2014-05-02 ENCOUNTER — Ambulatory Visit (INDEPENDENT_AMBULATORY_CARE_PROVIDER_SITE_OTHER): Payer: BLUE CROSS/BLUE SHIELD | Admitting: Family Medicine

## 2014-05-02 ENCOUNTER — Encounter: Payer: Self-pay | Admitting: Family Medicine

## 2014-05-02 VITALS — BP 146/80 | HR 80 | Wt 182.0 lb

## 2014-05-02 DIAGNOSIS — K439 Ventral hernia without obstruction or gangrene: Secondary | ICD-10-CM

## 2014-05-02 DIAGNOSIS — I10 Essential (primary) hypertension: Secondary | ICD-10-CM

## 2014-05-02 NOTE — Progress Notes (Signed)
CC: Hayden Patrick is a 39 y.o. male is here for Acute Visit   Subjective: HPI:  He notices that outside of our office blood pressures are consistently above 140/90 however less than stage II hypertension. He tells me that he does not watch sodium intake in his diet and has stopped his daily exercise routine that Blood pressure and weight in control over a year ago. 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 Denies chest pain shortness of breath orthopnea nor peripheral edema. He wants to know what he can do to help lower blood pressure.  He asks whether or not he should be concerned about his ventral hernia. He tells me it still painless and does not appear to be getting larger or smaller. He wants to start working out but is afraid that it'll exacerbate the hernia. Denies any nausea, vomiting, abdominal pain diarrhea constipation or any other gastrointestinal complaints   Review Of Systems Outlined In HPI  Past Medical History  Diagnosis Date  . Inguinal hernia bilateral  . DEPRESSION 08/16/2007    Qualifier: Diagnosis of  By: Thomos LemonsBowen DO, Karen      Past Surgical History  Procedure Laterality Date  . Tonsillectomy    . Hernia repair  1990, [facility]  . Left inguinal hernia x 2     Family History  Problem Relation Age of Onset  . Hypertension Father   . Heart failure Father   . Hyperlipidemia Father   . Heart attack Neg Hx   . Diabetes Neg Hx   . Sudden death Neg Hx     History   Social History  . Marital Status: Divorced    Spouse Name: N/A  . Number of Children: N/A  . Years of Education: N/A   Occupational History  . Not on file.   Social History Main Topics  . Smoking status: Never Smoker   . Smokeless tobacco: Never Used  . Alcohol Use: Yes  . Drug Use: No  . Sexual Activity: Not on file   Other Topics Concern  . Not on file   Social History Narrative     Objective: BP 146/80 mmHg  Pulse 80  Wt 182 lb  (82.555 kg)  SpO2 100%  Vital signs reviewed. General: Alert and Oriented, No Acute Distress HEENT: Pupils equal, round, reactive to light. Conjunctivae clear.  External ears unremarkable.  Moist mucous membranes. Lungs: Clear and comfortable work of breathing, speaking in full sentences without accessory muscle use. Cardiac: Regular rate and rhythm.  Neuro: CN II-XII grossly intact, gait normal. Extremities: No peripheral edema.  Strong peripheral pulses.  Mental Status: No depression, anxiety, nor agitation. Logical though process. Skin: Warm and dry. Assessment & Plan: Hayden was seen today for acute visit.  Diagnoses and all orders for this visit:  Essential hypertension  Ventral hernia without obstruction or gangrene   Essential hypertension: Time was taken to discussed the DASH diet, sodium restriction below 2000 mg a day, avoiding processed foods, and increasing exercise on a daily basis. For example I advised him to take 30-40 minutes most days of the week for moderate physical activity but avoid strenuous lifting, focus on cardio and aerobic activity. Ventral hernia: Reassurance was provided that provided he does not engage in strenuous physical activity such as weight lifting it's very unlikely this will progress. He is going to follow-up in 3 weeks to determine if he needs to start  blood pressure lowering medications.  25 minutes spent face-to-face during visit today of which at least 50% was counseling or coordinating care regarding: 1. Essential hypertension   2. Ventral hernia without obstruction or gangrene      Return in about 3 weeks (around 05/23/2014) for BP.

## 2014-05-02 NOTE — Patient Instructions (Signed)
DASH Eating Plan °DASH stands for "Dietary Approaches to Stop Hypertension." The DASH eating plan is a healthy eating plan that has been shown to reduce high blood pressure (hypertension). Additional health benefits may include reducing the risk of type 2 diabetes mellitus, heart disease, and stroke. The DASH eating plan may also help with weight loss. °WHAT DO I NEED TO KNOW ABOUT THE DASH EATING PLAN? °For the DASH eating plan, you will follow these general guidelines: °· Choose foods with a percent daily value for sodium of less than 5% (as listed on the food label). °· Use salt-free seasonings or herbs instead of table salt or sea salt. °· Check with your health care provider or pharmacist before using salt substitutes. °· Eat lower-sodium products, often labeled as "lower sodium" or "no salt added." °· Eat fresh foods. °· Eat more vegetables, fruits, and low-fat dairy products. °· Choose whole grains. Look for the word "whole" as the first word in the ingredient list. °· Choose fish and skinless chicken or turkey more often than red meat. Limit fish, poultry, and meat to 6 oz (170 g) each day. °· Limit sweets, desserts, sugars, and sugary drinks. °· Choose heart-healthy fats. °· Limit cheese to 1 oz (28 g) per day. °· Eat more home-cooked food and less restaurant, buffet, and fast food. °· Limit fried foods. °· Cook foods using methods other than frying. °· Limit canned vegetables. If you do use them, rinse them well to decrease the sodium. °· When eating at a restaurant, ask that your food be prepared with less salt, or no salt if possible. °WHAT FOODS CAN I EAT? °Seek help from a dietitian for individual calorie needs. °Grains °Whole grain or whole wheat bread. Brown rice. Whole grain or whole wheat pasta. Quinoa, bulgur, and whole grain cereals. Low-sodium cereals. Corn or whole wheat flour tortillas. Whole grain cornbread. Whole grain crackers. Low-sodium crackers. °Vegetables °Fresh or frozen vegetables  (raw, steamed, roasted, or grilled). Low-sodium or reduced-sodium tomato and vegetable juices. Low-sodium or reduced-sodium tomato sauce and paste. Low-sodium or reduced-sodium canned vegetables.  °Fruits °All fresh, canned (in natural juice), or frozen fruits. °Meat and Other Protein Products °Ground beef (85% or leaner), grass-fed beef, or beef trimmed of fat. Skinless chicken or turkey. Ground chicken or turkey. Pork trimmed of fat. All fish and seafood. Eggs. Dried beans, peas, or lentils. Unsalted nuts and seeds. Unsalted canned beans. °Dairy °Low-fat dairy products, such as skim or 1% milk, 2% or reduced-fat cheeses, low-fat ricotta or cottage cheese, or plain low-fat yogurt. Low-sodium or reduced-sodium cheeses. °Fats and Oils °Tub margarines without trans fats. Light or reduced-fat mayonnaise and salad dressings (reduced sodium). Avocado. Safflower, olive, or canola oils. Natural peanut or almond butter. °Other °Unsalted popcorn and pretzels. °The items listed above may not be a complete list of recommended foods or beverages. Contact your dietitian for more options. °WHAT FOODS ARE NOT RECOMMENDED? °Grains °White bread. White pasta. White rice. Refined cornbread. Bagels and croissants. Crackers that contain trans fat. °Vegetables °Creamed or fried vegetables. Vegetables in a cheese sauce. Regular canned vegetables. Regular canned tomato sauce and paste. Regular tomato and vegetable juices. °Fruits °Dried fruits. Canned fruit in light or heavy syrup. Fruit juice. °Meat and Other Protein Products °Fatty cuts of meat. Ribs, chicken wings, bacon, sausage, bologna, salami, chitterlings, fatback, hot dogs, bratwurst, and packaged luncheon meats. Salted nuts and seeds. Canned beans with salt. °Dairy °Whole or 2% milk, cream, half-and-half, and cream cheese. Whole-fat or sweetened yogurt. Full-fat   cheeses or blue cheese. Nondairy creamers and whipped toppings. Processed cheese, cheese spreads, or cheese  curds. °Condiments °Onion and garlic salt, seasoned salt, table salt, and sea salt. Canned and packaged gravies. Worcestershire sauce. Tartar sauce. Barbecue sauce. Teriyaki sauce. Soy sauce, including reduced sodium. Steak sauce. Fish sauce. Oyster sauce. Cocktail sauce. Horseradish. Ketchup and mustard. Meat flavorings and tenderizers. Bouillon cubes. Hot sauce. Tabasco sauce. Marinades. Taco seasonings. Relishes. °Fats and Oils °Butter, stick margarine, lard, shortening, ghee, and bacon fat. Coconut, palm kernel, or palm oils. Regular salad dressings. °Other °Pickles and olives. Salted popcorn and pretzels. °The items listed above may not be a complete list of foods and beverages to avoid. Contact your dietitian for more information. °WHERE CAN I FIND MORE INFORMATION? °[employer], [employer], [employer]: www.nhlbi.nih.gov/health/health-topics/topics/dash/ °Document Released: 01/06/2011 Document Revised: 06/03/2013 Document Reviewed: 11/21/2012 °[employer]® Patient Information ©2015 [employer]. This information is not intended to replace advice given to you by your health care provider. Make sure you discuss any questions you have with your health care provider. ° °

## 2014-05-22 ENCOUNTER — Ambulatory Visit (INDEPENDENT_AMBULATORY_CARE_PROVIDER_SITE_OTHER): Payer: BLUE CROSS/BLUE SHIELD | Admitting: Family Medicine

## 2014-05-22 ENCOUNTER — Encounter: Payer: Self-pay | Admitting: Family Medicine

## 2014-05-22 VITALS — BP 130/85 | HR 77 | Wt 172.0 lb

## 2014-05-22 DIAGNOSIS — I10 Essential (primary) hypertension: Secondary | ICD-10-CM | POA: Diagnosis not present

## 2014-05-22 DIAGNOSIS — K439 Ventral hernia without obstruction or gangrene: Secondary | ICD-10-CM | POA: Diagnosis not present

## 2014-05-22 NOTE — Progress Notes (Signed)
CC: Hayden Patrick is a 39 y.o. male is here for Hypertension   Subjective: HPI:  Follow-up essential hypertension: Since I saw him last he's lost 10 pounds intentionally. He's cut out sodium in his diet and is not eating out as frequently. He's been very meticulous about keeping sodium that is diet and he is getting well below 2000 g of sodium a daily basis. He is working out 5 days a week. He denies chest pain shortness of breath orthopnea peripheral edema motor or sensory disturbances he's been checking his blood pressure outside of our office and values aren't stage I hypertension when he initially sits down to have the reading done and within 1 minute blood pressures are normotensive. After only sitting and resting.  He would like to talk a little bit more about his ventral hernia. He's noticed that has gotten smaller since he's lost weight. He wants to know if this is normal and if he should worry about it getting enlarged or possibly causing serious health complications. He works out 5 days a week but does not do any strenuous activity with any additional weights. Denies abdominal pain nausea vomiting or constipation/diarrhea  Review Of Systems Outlined In HPI  Past Medical History  Diagnosis Date  . Inguinal hernia bilateral  . DEPRESSION 08/16/2007    Qualifier: Diagnosis of  By: Thomos Lemons      Past Surgical History  Procedure Laterality Date  . Tonsillectomy    . Hernia repair  1990, 2002    [facility]  . Left inguinal hernia x 2     Family History  Problem Relation Age of Onset  . Hypertension Father   . Heart failure Father   . Hyperlipidemia Father   . Heart attack Neg Hx   . Diabetes Neg Hx   . Sudden death Neg Hx     History   Social History  . Marital Status: Divorced    Spouse Name: N/A  . Number of Children: N/A  . Years of Education: N/A   Occupational History  . Not on file.   Social History Main Topics  . Smoking status: Never Smoker   . Smokeless  tobacco: Never Used  . Alcohol Use: Yes  . Drug Use: No  . Sexual Activity: Not on file   Other Topics Concern  . Not on file   Social History Narrative     Objective: BP 130/85 mmHg  Pulse 77  Wt 172 lb (78.019 kg)  Vital signs reviewed. General: Alert and Oriented, No Acute Distress HEENT: Pupils equal, round, reactive to light. Conjunctivae clear.  External ears unremarkable.  Moist mucous membranes. Lungs: Clear and comfortable work of breathing, speaking in full sentences without accessory muscle use. Cardiac: Regular rate and rhythm.  Neuro: CN II-XII grossly intact, gait normal. Extremities: No peripheral edema.  Strong peripheral pulses.  Mental Status: No depression, anxiety, nor agitation. Logical though process. Skin: Warm and dry. Assessment & Plan: Hayden was seen today for hypertension.  Diagnoses and all orders for this visit:  Essential hypertension  Ventral hernia without obstruction or gangrene   Essential hypertension: Controlled with diet and exercise interventions. Continue these endeavors and follow-up in 3 months Ventral hernia: Reassurance again was provided that these hernias rarely ever cause any incarceration or pain, discussed the continue weight loss will help reduce the size and appearance of the hernia. Also added that if he focuses on core exercises this may also help reduce the appearance of the  hernia.  25 minutes spent face-to-face during visit today of which at least 50% was counseling or coordinating care regarding: 1. Essential hypertension   2. Ventral hernia without obstruction or gangrene      Return in about 3 months (around 08/21/2014).

## 2014-08-21 ENCOUNTER — Ambulatory Visit: Payer: BLUE CROSS/BLUE SHIELD | Admitting: Family Medicine

## 2017-03-21 DIAGNOSIS — J111 Influenza due to unidentified influenza virus with other respiratory manifestations: Secondary | ICD-10-CM | POA: Diagnosis not present

## 2017-11-07 DIAGNOSIS — Z23 Encounter for immunization: Secondary | ICD-10-CM | POA: Diagnosis not present

## 2018-07-30 DIAGNOSIS — R2689 Other abnormalities of gait and mobility: Secondary | ICD-10-CM | POA: Diagnosis not present

## 2018-07-30 DIAGNOSIS — M25572 Pain in left ankle and joints of left foot: Secondary | ICD-10-CM | POA: Diagnosis not present

## 2018-08-01 DIAGNOSIS — M25572 Pain in left ankle and joints of left foot: Secondary | ICD-10-CM | POA: Diagnosis not present

## 2018-08-01 DIAGNOSIS — R2689 Other abnormalities of gait and mobility: Secondary | ICD-10-CM | POA: Diagnosis not present

## 2018-08-06 ENCOUNTER — Other Ambulatory Visit: Payer: Self-pay

## 2018-08-06 ENCOUNTER — Emergency Department
Admission: EM | Admit: 2018-08-06 | Discharge: 2018-08-06 | Disposition: A | Discharge status: Home / Self Care | Payer: BLUE CROSS/BLUE SHIELD | Source: Home / Self Care

## 2018-08-06 DIAGNOSIS — R1031 Right lower quadrant pain: Secondary | ICD-10-CM

## 2018-08-06 DIAGNOSIS — M545 Low back pain, unspecified: Secondary | ICD-10-CM

## 2018-08-06 DIAGNOSIS — M25572 Pain in left ankle and joints of left foot: Secondary | ICD-10-CM | POA: Diagnosis not present

## 2018-08-06 DIAGNOSIS — R2689 Other abnormalities of gait and mobility: Secondary | ICD-10-CM | POA: Diagnosis not present

## 2018-08-06 NOTE — Discharge Instructions (Signed)
°  Please follow up with family medicine if symptoms not improving in 1 week. Please follow up sooner if pain worsens.   You may take tylenol and motrin as needed for pain.

## 2018-08-06 NOTE — ED Provider Notes (Signed)
[facility]    CSN: [id number] Arrival date & time: 08/06/18  1028     History   Chief Complaint Chief Complaint  Patient presents with  . Abdominal Pain  . Back Pain    HPI [place]had J Pasko is a 43 y.o. male.   HPI [place]had J Wheelwright is a 43 y.o. male presenting to UC with c/o Right low back pain radiating into Right groin that started about 2 days ago.  Pt states he believes it is due to sitting in an inflatable pool for about 1 hour then attempting to stand, causing a brief period of pain in this area but no other known injuries. He does have a hx of a Left inguinal hernia, which was first repaired when he was a child and then a second time a few years ago. He does not know what caused the hernias on the Left but he is always cautious now when he has pain in his groin. He was at PT today for Left foot pain, he mentioned his back and groin pain. Pt was advised it was likely a muscle strain, however, was encouraged to have a hernia r/o as that is outside the scope of practice for PT.   Pt denies fever, chills, n/v/d. Denies urinary symptoms.    Past Medical History:  Diagnosis Date  . DEPRESSION 08/16/2007   Qualifier: Diagnosis of  By: Thomos LemonsBowen DO, Karen    . Inguinal hernia bilateral    Patient Active Problem List   Diagnosis Date Noted  . Essential hypertension 05/02/2014  . Hyperlipidemia 02/07/2013  . Ventral hernia 02/06/2013  . Right groin pain 12/07/2010  . TINEA VERSICOLOR 09/03/2009    Past Surgical History:  Procedure Laterality Date  . HERNIA REPAIR  1990, 2002   [facility]  . left inguinal hernia x 2    . TONSILLECTOMY         Home Medications    Prior to Admission medications   Not on File    Family History Family History  Problem Relation Age of Onset  . Hypertension Father   . Heart failure Father   . Hyperlipidemia Father   . Heart attack Neg Hx   . Diabetes Neg Hx   . Sudden death Neg Hx     Social History Social History   Tobacco  Use  . Smoking status: Never Smoker  . Smokeless tobacco: Never Used  Substance Use Topics  . Alcohol use: Yes  . Drug use: No     Allergies   Patient has no known allergies.   Review of Systems Review of Systems  Constitutional: Negative for chills and fever.  Gastrointestinal: Positive for abdominal pain (Right groin). Negative for diarrhea, nausea and vomiting.  Genitourinary: Negative for dysuria, flank pain, frequency and hematuria.  Musculoskeletal: Positive for back pain.     Physical Exam Triage Vital Signs ED Triage Vitals  Enc Vitals Group     BP 08/06/18 1047 (!) 151/93     Pulse Rate 08/06/18 1047 82     Resp 08/06/18 1047 20     Temp 08/06/18 1046 98.1 F (36.7 C)     Temp Source 08/06/18 1047 Oral     SpO2 08/06/18 1047 100 %     Weight 08/06/18 1048 177 lb (80.3 kg)     Height 08/06/18 1048 5\' 10"  (1.778 m)     Head Circumference --      Peak Flow --      Pain  Score 08/06/18 1047 4     Pain Loc --      Pain Edu? --      Excl. in [place]? --    No data found.  Updated Vital Signs BP (!) 151/93 (BP Location: Right Arm)   Pulse 82   Temp 98.1 F (36.7 C) (Oral)   Resp 20   Ht 5\' 10"  (1.778 m)   Wt 177 lb (80.3 kg)   SpO2 100%   BMI 25.40 kg/m   Visual Acuity Right Eye Distance:   Left Eye Distance:   Bilateral Distance:    Right Eye Near:   Left Eye Near:    Bilateral Near:     Physical Exam Vitals signs and nursing note reviewed. Exam conducted with a chaperone present.  Constitutional:      Appearance: He is well-developed.  HENT:     Head: Normocephalic and atraumatic.  Neck:     Musculoskeletal: Normal range of motion.  Cardiovascular:     Rate and Rhythm: Normal rate and regular rhythm.  Pulmonary:     Effort: Pulmonary effort is normal.     Breath sounds: Normal breath sounds.  Abdominal:     General: There is no distension.     Palpations: Abdomen is soft.     Tenderness: There is no abdominal tenderness. There is no right  CVA tenderness or left CVA tenderness.     Hernia: No hernia is present. There is no hernia in the umbilical area, right femoral area or right inguinal area.  Genitourinary:    Penis: Normal.      Scrotum/Testes: Normal.        Right: Mass, tenderness or swelling not present.     Epididymis:     Right: Normal.  Musculoskeletal: Normal range of motion.  Skin:    General: Skin is warm and dry.  Neurological:     Mental Status: He is alert and oriented to person, place, and time.  Psychiatric:        Behavior: Behavior normal.      UC Treatments / Results  Labs (all labs ordered are listed, but only abnormal results are displayed) Labs Reviewed - No data to display  EKG   Radiology No results found.  Procedures Procedures (including critical care time)  Medications Ordered in UC Medications - No data to display  Initial Impression / Assessment and Plan / UC Course  I have reviewed the triage vital signs and the nursing notes.  Pertinent labs & imaging results that were available during my care of the patient were reviewed by me and considered in my medical decision making (see chart for details).    Normal exam. Reassured pt Encouraged f/u with PCP for ongoing healthcare needs and recheck of symptoms. AVS provided Discussed symptoms that warrant emergent care in the ED.  Final Clinical Impressions(s) / UC Diagnoses   Final diagnoses:  Acute right-sided low back pain without sciatica  Right inguinal pain     Discharge Instructions      Please follow up with family medicine if symptoms not improving in 1 week. Please follow up sooner if pain worsens.   You may take tylenol and motrin as needed for pain.     ED Prescriptions    None     Controlled Substance Prescriptions [place] Controlled Substance Registry consulted? Not Applicable   Tyrell Antonio 08/06/18 1328

## 2018-08-06 NOTE — ED Triage Notes (Signed)
Having pain in the lower right groin/back pain.  Concerned about possible hernia?

## 2018-08-08 DIAGNOSIS — R2689 Other abnormalities of gait and mobility: Secondary | ICD-10-CM | POA: Diagnosis not present

## 2018-08-08 DIAGNOSIS — M25572 Pain in left ankle and joints of left foot: Secondary | ICD-10-CM | POA: Diagnosis not present

## 2018-08-13 DIAGNOSIS — R2689 Other abnormalities of gait and mobility: Secondary | ICD-10-CM | POA: Diagnosis not present

## 2018-08-13 DIAGNOSIS — M25572 Pain in left ankle and joints of left foot: Secondary | ICD-10-CM | POA: Diagnosis not present

## 2018-08-15 DIAGNOSIS — R2689 Other abnormalities of gait and mobility: Secondary | ICD-10-CM | POA: Diagnosis not present

## 2018-08-15 DIAGNOSIS — M25572 Pain in left ankle and joints of left foot: Secondary | ICD-10-CM | POA: Diagnosis not present

## 2018-08-17 ENCOUNTER — Emergency Department
Admission: EM | Admit: 2018-08-17 | Discharge: 2018-08-17 | Disposition: A | Discharge status: Home / Self Care | Payer: BC Managed Care – PPO | Source: Home / Self Care | Attending: Emergency Medicine | Admitting: Emergency Medicine

## 2018-08-17 ENCOUNTER — Encounter: Payer: Self-pay | Admitting: Emergency Medicine

## 2018-08-17 ENCOUNTER — Other Ambulatory Visit: Payer: Self-pay

## 2018-08-17 DIAGNOSIS — R1031 Right lower quadrant pain: Secondary | ICD-10-CM

## 2018-08-17 MED ORDER — IBUPROFEN 200 MG PO TABS: ORAL_TABLET | ORAL | 0 refills | Status: DC

## 2018-08-17 NOTE — Discharge Instructions (Addendum)
Based on history and physical exam today, there is no evidence of slipped disc or spinal problem.  No evidence of hernia. Your recurrent pain in the right groin and hip is likely from muscle inflammation, scar tissue. Advise continuing physical therapy, may alternate heat and ice.  You need to moderate your exercise and not do high intensity exercise for now. Take ibuprofen, OTC, 3 every 8 hours with food around-the-clock for 5 days to see if that helps. You need to call and make an appointment for follow-up with Dr. Dianah Field or Dr. Georgina Snell, who or sports medicine specialist at the same office where Dr. Ileene Rubens was.

## 2018-08-17 NOTE — ED Triage Notes (Signed)
Patient here for evaluation of intermittent right groin pain that dates back 17 years with exacerbation 08/04/18; seeing chiropractor and wants medical opinion on how to proceed. Denies dysuria. Was also here 08/06/18. Has not travelled past 4 weeks.

## 2018-08-17 NOTE — ED Provider Notes (Signed)
[facility]    CSN: [id number] Arrival date & time: 08/17/18  0956     History   Chief Complaint Chief Complaint  Patient presents with  . Groin Pain    HPI Hayden Patrick is a 43 y.o. male.   HPI Patient here for evaluation of intermittent right groin pain that dates back 17 years with exacerbation 08/04/18, he feels was exacerbated when he was sitting in an inflatable pool for about an hour and then attempted to stand up fast, causing pain vaguely in the right hip and right inguinal area without radiation.  No associated bowel or bladder problems.  No numbness or tingling or weakness;  He is seeing "chiropractor/physical therapist" and requests medical opinion on how to proceed.  He states he exercises vigorously all his life and has continued to exercise, even with the pain.   He was seen here by a different provider on 08/06/2018, with similar symptoms, but came back to our urgent care because symptoms persisted, perhaps a little worse.  He states he is frustrated that he is not all better. History of left inguinal surgery in the past.  He states he requests to be checked for inguinal hernia.  Has not noticed a bulge in the inguinal area.  Denies any problems voiding.  No problems with urinary stream.  No dysuria.  No problems with sexual function.  Past Medical History:  Diagnosis Date  . DEPRESSION 08/16/2007   Qualifier: Diagnosis of  By: Esmeralda Arthur    . Inguinal hernia bilateral    Patient Active Problem List   Diagnosis Date Noted  . Essential hypertension 05/02/2014  . Hyperlipidemia 02/07/2013  . Ventral hernia 02/06/2013  . Right groin pain 12/07/2010  . TINEA VERSICOLOR 09/03/2009    Past Surgical History:  Procedure Laterality Date  . [place], 2002   [facility]  . left inguinal hernia x 2    . TONSILLECTOMY         Home Medications    Prior to Admission medications   Medication Sig Start Date End Date Taking? Authorizing Provider   ibuprofen (ADVIL) 200 MG tablet Take three tablets ( 600 milligrams total) every 8 with food for pain, for 5 days 08/17/18   Jacqulyn Cane, MD    Family History Family History  Problem Relation Age of Onset  . Hypertension Father   . Heart failure Father   . Hyperlipidemia Father   . Heart attack Neg Hx   . Diabetes Neg Hx   . Sudden death Neg Hx     Social History Social History   Tobacco Use  . Smoking status: Never Smoker  . Smokeless tobacco: Never Used  Substance Use Topics  . Alcohol use: Yes  . Drug use: No     Allergies   Patient has no known allergies.   Review of Systems Review of Systems Pertinent items noted in HPI and remainder of comprehensive ROS otherwise negative.   Physical Exam Triage Vital Signs ED Triage Vitals  Enc Vitals Group     BP 08/17/18 1126 125/84     Pulse Rate 08/17/18 1126 82     Resp 08/17/18 1126 18     Temp 08/17/18 1126 98.4 F (36.9 C)     Temp Source 08/17/18 1126 Oral     SpO2 08/17/18 1126 99 %     Weight --      Height --      Head Circumference --  Peak Flow --      Pain Score 08/17/18 1127 0     Pain Loc --      Pain Edu? --      Excl. in [place]? --    No data found.  Updated Vital Signs BP 125/84 (BP Location: Right Arm)   Pulse 82   Temp 98.4 F (36.9 C) (Oral)   Resp 18   SpO2 99%    Physical Exam Vitals signs reviewed.  Constitutional:      General: He is not in acute distress.    Appearance: He is well-developed.  HENT:     Head: Normocephalic and atraumatic.  Eyes:     General: No scleral icterus.    Pupils: Pupils are equal, round, and reactive to light.  Neck:     Musculoskeletal: Normal range of motion and neck supple.  Cardiovascular:     Rate and Rhythm: Normal rate and regular rhythm.  Pulmonary:     Effort: Pulmonary effort is normal.  Abdominal:     General: Bowel sounds are normal. There is no distension or abdominal bruit. There are no signs of injury.     Palpations:  Abdomen is soft.     Tenderness: There is no abdominal tenderness. There is no right CVA tenderness, left CVA tenderness, guarding or rebound. Negative signs include Murphy's sign, Rovsing's sign, McBurney's sign, psoas sign and obturator sign.     Hernia: No hernia is present.  Genitourinary:    Penis: Normal.      Scrotum/Testes:        Right: Tenderness (Minimal, nonreproducible tenderness without mass over right inguinal area..  Patient checked in multiple positions with Valsalva and coughing, no hernia appreciated.) present. Mass, swelling, testicular hydrocele or varicocele not present.        Left: Mass, tenderness, swelling, testicular hydrocele or varicocele not present.    Musculoskeletal:     Right hip: Normal. He exhibits normal range of motion, normal strength, no tenderness, no bony tenderness, no swelling, no crepitus and no deformity.     Lumbar back: Normal. He exhibits normal range of motion, no tenderness, no bony tenderness, no swelling and no spasm.       Legs:  Skin:    General: Skin is warm and dry.     Capillary Refill: Capillary refill takes less than 2 seconds.     Findings: No rash.  Neurological:     Mental Status: He is alert and oriented to person, place, and time.     Cranial Nerves: No cranial nerve deficit.     Sensory: Sensation is intact.     Motor: Motor function is intact.     Gait: Gait is intact.     Deep Tendon Reflexes:     Reflex Scores:      Patellar reflexes are 2+ on the right side and 2+ on the left side.      Achilles reflexes are 2+ on the right side and 2+ on the left side. Psychiatric:        Behavior: Behavior normal.    Both feet, normal pulses.  Neurovascular intact distally.  No leg edema or tenderness.  UC Treatments / Results  Labs (all labs ordered are listed, but only abnormal results are displayed) Labs Reviewed - No data to display  EKG   Radiology No results found.  Procedures Procedures (including critical  care time)  Medications Ordered in UC Medications - No data to display  Initial Impression /  Assessment and Plan / UC Course  I have reviewed the triage vital signs and the nursing notes.  Pertinent labs & imaging results that were available during my care of the patient were reviewed by me and considered in my medical decision making (see chart for details).      Final Clinical Impressions(s) / UC Diagnoses   Final diagnoses:  Pain in the groin, right  Over 25 minutes spent, greater than 50% of the time spent for counseling and coordination of care. After lengthy discussion, I do not find any evidence on physical exam that requires imaging at this time.-No evidence of hernia on exam.  He likely has a hip flexor muscle strain.  No neurologic deficit.  He denies any lumbar pain and is nontender over lumbar spine. He agrees with not doing any imaging today.  He does not have any symptoms which would suggest ordering a urinalysis.  No hematuria or dysuria or any voiding problems or any problems with sex.  No urethral discharge. Discussed moderating exercise, instead gentle stretching until evaluated by sports medicine.  I explained the need for sports medicine specialist consultation because of this recurrent problem over many years.   He states he avoids OTC meds, but I advised a trial of ibuprofen 3 times daily with food, around the clock, over a week to see if symptoms improve.  Precautions with NSAIDs discussed. I reinforced to him that he needs to get follow-up appointment with sports medicine specialist within 7 days if not improving. He voiced understanding.  An After Visit Summary was printed and given to the patient. Precautions discussed. Red flags discussed. Questions invited and answered. Patient voiced understanding and agreement.    Discharge Instructions     Based on history and physical exam today, there is no evidence of slipped disc or spinal problem.  No evidence of  hernia. Your recurrent pain in the right groin and hip is likely from muscle inflammation, scar tissue. Advise continuing physical therapy, may alternate heat and ice.  You need to moderate your exercise and not do high intensity exercise for now. Take ibuprofen, OTC, 3 every 8 hours with food around-the-clock for 5 days to see if that helps. You need to call and make an appointment for follow-up with Dr. Benjamin Stainhekkekandam or Dr. Denyse Amassorey, who or sports medicine specialist at the same office where Dr. Ivan AnchorsHommel was.    ED Prescriptions    Medication Sig Dispense Auth. Provider   ibuprofen (ADVIL) 200 MG tablet Take three tablets ( 600 milligrams total) every 8 with food for pain, for 5 days 30 tablet Lajean ManesMassey, David, MD        Lajean ManesMassey, David, MD 08/19/18 1525

## 2018-08-20 DIAGNOSIS — R2689 Other abnormalities of gait and mobility: Secondary | ICD-10-CM | POA: Diagnosis not present

## 2018-08-20 DIAGNOSIS — M25572 Pain in left ankle and joints of left foot: Secondary | ICD-10-CM | POA: Diagnosis not present

## 2018-08-22 DIAGNOSIS — M25572 Pain in left ankle and joints of left foot: Secondary | ICD-10-CM | POA: Diagnosis not present

## 2018-08-22 DIAGNOSIS — R2689 Other abnormalities of gait and mobility: Secondary | ICD-10-CM | POA: Diagnosis not present

## 2018-08-27 DIAGNOSIS — R2689 Other abnormalities of gait and mobility: Secondary | ICD-10-CM | POA: Diagnosis not present

## 2018-08-27 DIAGNOSIS — M25572 Pain in left ankle and joints of left foot: Secondary | ICD-10-CM | POA: Diagnosis not present

## 2018-08-30 DIAGNOSIS — M25572 Pain in left ankle and joints of left foot: Secondary | ICD-10-CM | POA: Diagnosis not present

## 2018-08-30 DIAGNOSIS — R2689 Other abnormalities of gait and mobility: Secondary | ICD-10-CM | POA: Diagnosis not present

## 2018-09-03 DIAGNOSIS — M25572 Pain in left ankle and joints of left foot: Secondary | ICD-10-CM | POA: Diagnosis not present

## 2018-09-03 DIAGNOSIS — R2689 Other abnormalities of gait and mobility: Secondary | ICD-10-CM | POA: Diagnosis not present

## 2018-09-06 ENCOUNTER — Other Ambulatory Visit: Payer: Self-pay

## 2018-09-06 ENCOUNTER — Encounter: Payer: Self-pay | Admitting: Family Medicine

## 2018-09-06 ENCOUNTER — Ambulatory Visit: Payer: BC Managed Care – PPO | Admitting: Family Medicine

## 2018-09-06 ENCOUNTER — Ambulatory Visit (INDEPENDENT_AMBULATORY_CARE_PROVIDER_SITE_OTHER): Payer: BC Managed Care – PPO

## 2018-09-06 VITALS — BP 142/86 | HR 97 | Temp 98.2°F | Wt 177.0 lb

## 2018-09-06 DIAGNOSIS — M25551 Pain in right hip: Secondary | ICD-10-CM | POA: Diagnosis not present

## 2018-09-06 NOTE — Progress Notes (Signed)
Subjective:    CC: Right groin pain  HPI: right groin and hip pain ongpong intermintantly 43 years.  He has times with the pain is worse at times and the pain is better.  He has had extensive work-ups in the past occluding CT scans ultrasounds and MRIs are all largely unrevealing.  He did really well with no pain for about 6 years.  However starting about a month ago he developed again some more pain in his groin.  He notes this vague discomfort or pain in his groin.  He followed up with a physical therapist that he seen before for these issues and started on a physical therapy protocol.  He is feeling okay but wants to get double check to make sure he is not missing anything more serious.  He notes the pain does not tend to occur usually with exercise.  It tends occur with normal life activities.  Sometimes he feels a little discomfort or pain before he has a bowel movement.  He predominately feels his symptoms in the anterior groin and hip.  He does a lot of martial arts.    Past medical history, Surgical history, Family history not pertinant except as noted below, Social history, Allergies, and medications have been entered into the medical record, reviewed, and no changes needed.   Review of Systems: No headache, visual changes, nausea, vomiting, diarrhea, constipation, dizziness, abdominal pain, skin rash, fevers, chills, night sweats, weight loss, swollen lymph nodes, body aches, joint swelling, muscle aches, chest pain, shortness of breath, mood changes, visual or auditory hallucinations.   Objective:    Vitals:   09/06/18 1128  BP: (!) 142/86  Pulse: 97  Temp: 98.2 F (36.8 C)   General: Well Developed, well nourished, and in no acute distress.  Neuro/Psych: Alert and oriented x3, extra-ocular muscles intact, able to move all 4 extremities, sensation grossly intact. Skin: Warm and dry, no rashes noted.  Respiratory: Not using accessory muscles, speaking in full  sentences, trachea midline.  Cardiovascular: Pulses palpable, no extremity edema. Abdomen: Does not appear distended. MSK: L-spine: Nontender to spinal midline.  Decreased extension range of motion otherwise normal. Poor voluntary control of paraspinal musculature.  Right hip: Normal-appearing normal motion. Tender palpation mildly greater trochanter.  Nontender otherwise. Excellent hip flexion strength.  Diminished hip abduction strength 4/5.  Left hip normal-appearing nontender normal motion normal strength  Slight leg length discrepancy.  Right leg is about a half a centimeter longer than the left.  Hernia exam reveals no hernia left side with well-appearing external hernia repair scar. Right hernia exam reveals slight bulging with cough no palpable hernia present however.  Testicle descended bilaterally nontender with no masses.  Lab and Radiology Results X-ray images right hip and pelvis obtained today personally independently reviewed No fractures or severe degenerative changes.  Slight cam appearance to femoral head and slight pincer appearance to acetabulum could cause mild femoral acetabular impingement. Await formal radiology overread  Impression and Recommendations:    Assessment and Plan: 43 y.o. male with right hip pain.  Likely multifactorial.  Patient has muscular imbalance of his core and hip likely resulting in some discomfort and dysfunction.  Also has slight bulging to the soft tissue in the inguinal region.  This is not quite a hernia and not quite a sports hernia however it could be enough bulging that he is feeling it with exercise and causing some discomfort.  After discussion plan for trial of physical therapy as I think  that the most likely thing to provide benefit.  Check back in 6 weeks if not improving.  We will proceed with further imaging if needed.  PDMP not reviewed this encounter. Orders Placed This Encounter  Procedures  . DG Hip Unilat W OR W/O Pelvis  2-3 Views Right    Standing Status:   Future    Number of Occurrences:   1    Standing Expiration Date:   11/06/2019    Order Specific Question:   Reason for Exam (SYMPTOM  OR DIAGNOSIS REQUIRED)    Answer:   eval pain right hip/groin. Eval femoral neck stress fx or FAI    Order Specific Question:   Preferred imaging location?    Answer:   [facility]    Order Specific Question:   Radiology Contrast Protocol - do NOT remove file path    Answer:   [email or web address]   No orders of the defined types were placed in this encounter.   Discussed warning signs or symptoms. Please see discharge instructions. Patient expresses understanding.

## 2018-09-06 NOTE — Patient Instructions (Signed)
Thank you for coming in today. Get xray now.  I think your pain is likely multifactorial.  I think you do have some bulging or weakness of the tissue in your groin Resposable for hernia  Currently you do not have a hernia.   You do have a lot of muscle imbalance. Work on core strength especially hip abductor and back.   Recheck in 6 weeks if not better.  You are welcome to return for this issue or any other ortho issue in the future.    Sports Hernia Rehab Ask your health care provider which exercises are safe for you. Do exercises exactly as told by your health care provider and adjust them as directed. It is normal to feel mild stretching, pulling, tightness, or discomfort as you do these exercises. Stop right away if you feel sudden pain or your pain gets worse. Do not begin these exercises until told by your health care provider. Stretching and range-of-motion exercise This exercise warms up your muscles and joints and improves the movement and flexibility around your hip and pelvis. Seated stretch This exercise is sometimes called hamstrings and adductors stretch. 1. Sit on the floor with your legs stretched wide. Keep your knees straight during this exercise. 2. Keeping your head and back in a straight line, bend at your waist to reach for your left foot (position A). You should feel a stretch in your right inner thigh (adductors). 3. Hold this position for __________ seconds. Then slowly return to the upright position. 4. Keeping your head and back in a straight line, bend at your waist to reach forward (position B). You should feel a stretch behind both of your thighs or knees (hamstrings). 5. Hold this position for __________ seconds. Then slowly return to the upright position. 6. Keeping your head and back in a straight line, bend at your waist to reach for your right foot (position C). You should feel a stretch in your left inner thigh (adductors). 7. Hold this position for  __________ seconds. Then slowly return to the upright position. Repeat __________ times. Complete this exercise __________ times a day. Strengthening exercises These exercises build strength around your hip and pelvis. Hip adduction, isometric This exercise is sometimes called inner thigh squeeze. 1. Sit on a firm chair that positions your knees at about the same height as your hips. 2. Place a large ball, firm pillow, or rolled-up bath towel between your thighs. 3. Squeeze your thighs together without moving your legs (isometric adduction), gradually building tension. 4. Hold this position for __________ seconds. 5. Let your muscles relax completely before you repeat this exercise. Repeat __________ times. Complete this exercise __________ times a day. Hip abduction, isometric 1. Sit on a firm chair. Your knees should be at about the same height as your hips. 2. Place one of your hands on the outside of each of your thighs, just above your knees. 3. Push your thighs away from each other, against your hands, so that little movement takes place (isometric abduction). Gradually build muscle tension. 4. Hold this position for __________ seconds. 5. Let your muscles relax completely before you repeat this exercise. Repeat __________ times. Complete this exercise __________ times a day. Pelvic tilt This exercise strengthens the muscles that lie deep in the abdomen. 1. Lie on your back on a firm bed or the floor. 2. Bend your knees and keep your feet flat on the floor. 3. Tense your abdominal muscles. Tip your pelvis up toward the ceiling and  flatten your lower back into the floor. Do not hold your breath. ? To help with this exercise, you may place a small towel under your lower back and try to push your back into the towel. 4. Hold this position for __________ seconds. 5. Let your muscles relax completely before you repeat this exercise. Repeat __________ times. Complete this exercise  __________ times a day. Hip adduction, isotonic This exercise is sometimes called side-lying straight leg raises. 1. Lie on your side so your head, shoulder, hip, and knee are in a straight line with each other. To help maintain your balance, you may put the foot of your top leg in front of the leg that is on the floor. Your left / right leg should be on the bottom. 2. Roll your hips slightly forward so your hips are stacked directly over each other and your left / right knee is facing forward. 3. Tense the muscles of your inner thigh and lift your bottom leg 4-6 inches (10-15 cm). Do not roll your body forward or backward. 4. Hold this position for __________ seconds. 5. Slowly return to the starting position. Repeat __________ times. Complete this exercise __________ times a day. This information is not intended to replace advice given to you by your health care provider. Make sure you discuss any questions you have with your health care provider. Document Released: 01/17/2005 Document Revised: 05/15/2018 Document Reviewed: 05/15/2018 Elsevier Patient Education  2020 [employer]

## 2018-09-10 DIAGNOSIS — M25572 Pain in left ankle and joints of left foot: Secondary | ICD-10-CM | POA: Diagnosis not present

## 2018-09-10 DIAGNOSIS — R2689 Other abnormalities of gait and mobility: Secondary | ICD-10-CM | POA: Diagnosis not present

## 2018-09-24 DIAGNOSIS — M25572 Pain in left ankle and joints of left foot: Secondary | ICD-10-CM | POA: Diagnosis not present

## 2018-09-24 DIAGNOSIS — R2689 Other abnormalities of gait and mobility: Secondary | ICD-10-CM | POA: Diagnosis not present

## 2018-10-03 DIAGNOSIS — M25572 Pain in left ankle and joints of left foot: Secondary | ICD-10-CM | POA: Diagnosis not present

## 2018-10-03 DIAGNOSIS — R2689 Other abnormalities of gait and mobility: Secondary | ICD-10-CM | POA: Diagnosis not present

## 2018-10-22 DIAGNOSIS — R2689 Other abnormalities of gait and mobility: Secondary | ICD-10-CM | POA: Diagnosis not present

## 2018-10-22 DIAGNOSIS — M25572 Pain in left ankle and joints of left foot: Secondary | ICD-10-CM | POA: Diagnosis not present

## 2018-10-24 DIAGNOSIS — M25572 Pain in left ankle and joints of left foot: Secondary | ICD-10-CM | POA: Diagnosis not present

## 2018-10-24 DIAGNOSIS — R2689 Other abnormalities of gait and mobility: Secondary | ICD-10-CM | POA: Diagnosis not present

## 2018-11-15 DIAGNOSIS — R2689 Other abnormalities of gait and mobility: Secondary | ICD-10-CM | POA: Diagnosis not present

## 2018-11-15 DIAGNOSIS — M25572 Pain in left ankle and joints of left foot: Secondary | ICD-10-CM | POA: Diagnosis not present

## 2019-09-18 ENCOUNTER — Emergency Department
Admission: RE | Admit: 2019-09-18 | Discharge: 2019-09-18 | Disposition: A | Discharge status: Home / Self Care | Payer: BC Managed Care – PPO | Source: Ambulatory Visit

## 2019-09-18 ENCOUNTER — Other Ambulatory Visit: Payer: Self-pay

## 2019-09-18 ENCOUNTER — Emergency Department (INDEPENDENT_AMBULATORY_CARE_PROVIDER_SITE_OTHER): Payer: BC Managed Care – PPO

## 2019-09-18 VITALS — BP 151/89 | HR 83 | Temp 98.2°F | Resp 18

## 2019-09-18 DIAGNOSIS — S99921A Unspecified injury of right foot, initial encounter: Secondary | ICD-10-CM

## 2019-09-18 DIAGNOSIS — W2189XA Striking against or struck by other sports equipment, initial encounter: Secondary | ICD-10-CM

## 2019-09-18 DIAGNOSIS — S92414A Nondisplaced fracture of proximal phalanx of right great toe, initial encounter for closed fracture: Secondary | ICD-10-CM

## 2019-09-18 DIAGNOSIS — Y9375 Activity, martial arts: Secondary | ICD-10-CM

## 2019-09-18 DIAGNOSIS — M79674 Pain in right toe(s): Secondary | ICD-10-CM

## 2019-09-18 NOTE — ED Provider Notes (Signed)
[facility]    CSN: [id number] Arrival date & time: 09/18/19  1441      History   Chief Complaint Chief Complaint  Patient presents with  . Toe Pain    RT great toe    HPI Hayden Patrick is a 44 y.o. male.   HPI  Patient presents for evaluation of right toe pain. Patient is involved in [employer] and kicked a board and immediately developed pain involving the right toe. Over the course of last two days the toe has become increasing discolored. He is able to bear weight although endorses mild discomfort without severe pain. Pain is present with plantar flexion of right foot. No prior injury to right foot. Past Medical History:  Diagnosis Date  . DEPRESSION 08/16/2007   Qualifier: Diagnosis of  By: Thomos Lemons    . Inguinal hernia bilateral    Patient Active Problem List   Diagnosis Date Noted  . Essential hypertension 05/02/2014  . Hyperlipidemia 02/07/2013  . Ventral hernia 02/06/2013  . Right groin pain 12/07/2010  . TINEA VERSICOLOR 09/03/2009    Past Surgical History:  Procedure Laterality Date  . HERNIA REPAIR  1990, 2002   LIH  . left inguinal hernia x 2    . TONSILLECTOMY         Home Medications    Prior to Admission medications   Medication Sig Start Date End Date Taking? Authorizing Provider  ibuprofen (ADVIL) 200 MG tablet Take three tablets ( 600 milligrams total) every 8 with food for pain, for 5 days 08/17/18   Lajean Manes, MD    Family History Family History  Problem Relation Age of Onset  . Hypertension Father   . Heart failure Father   . Hyperlipidemia Father   . Heart attack Neg Hx   . Diabetes Neg Hx   . Sudden death Neg Hx     Social History Social History   Tobacco Use  . Smoking status: Never Smoker  . Smokeless tobacco: Never Used  Vaping Use  . Vaping Use: Never used  Substance Use Topics  . Alcohol use: Yes  . Drug use: No     Allergies   Patient has no known allergies.   Review of  Systems Review of Systems Pertinent negatives listed in HPI  Physical Exam Triage Vital Signs ED Triage Vitals  Enc Vitals Group     BP 09/18/19 1518 (!) 151/89     Pulse Rate 09/18/19 1518 83     Resp 09/18/19 1518 18     Temp 09/18/19 1518 98.2 F (36.8 C)     Temp Source 09/18/19 1518 Oral     SpO2 09/18/19 1518 99 %     Weight --      Height --      Head Circumference --      Peak Flow --      Pain Score 09/18/19 1520 5     Pain Loc --      Pain Edu? --      Excl. in GC? --    No data found.  Updated Vital Signs BP (!) 151/89 (BP Location: Right Arm)   Pulse 83   Temp 98.2 F (36.8 C) (Oral)   Resp 18   SpO2 99%   Visual Acuity Right Eye Distance:   Left Eye Distance:   Bilateral Distance:    Right Eye Near:   Left Eye Near:    Bilateral Near:  Physical Exam   UC Treatments / Results  Labs (all labs ordered are listed, but only abnormal results are displayed) Labs Reviewed - No data to display  EKG   Radiology No results found.  Procedures Procedures (including critical care time)  Medications Ordered in UC Medications - No data to display  Initial Impression / Assessment and Plan / UC Course  I have reviewed the triage vital signs and the nursing notes.  Pertinent labs & imaging results that were available during my care of the patient were reviewed by me and considered in my medical decision making (see chart for details).    Non displaced fracture involving the right great toe. Buddy taped. Encouraged ice and anti-inflammatory to reduce inflammation. Recommended to refrain from wearing closed toe shoes to avoid pressure to the injured. Referral given to following with Dr. Karie Schwalbe for evaluation. Refrain from strenuous activity until follow-up with Dr. Karie Schwalbe. Normal activity as tolerated. An After Visit Summary was printed and given to the patient/family.vPrecautions discussed. Red flags discussed. Questions invited and answered.They voiced  understanding and agreement.  Final Clinical Impressions(s) / UC Diagnoses   Final diagnoses:  Injury of right great toe, initial encounter  Closed nondisplaced fracture of proximal phalanx of right great toe, initial encounter     Discharge Instructions     Continue ice and buddy taping to immobilize fractured toe. Follow-up with the provider listed for further evaluation of toe injury. Regular activity with the exception of avoiding aggressive physical activity such as climbing or martial arts participation.  You may take Naproxen or ibuprofen as needed for pain.    ED Prescriptions    None     PDMP not reviewed this encounter.   Bing Neighbors, [place] 09/19/19 2043

## 2019-09-18 NOTE — ED Triage Notes (Signed)
Pt c/o contusion or possible fracture of RT great big toe after kicking a board doing mixed martial arts. Some swelling or bruising noted. Advil prn. Pain 5/10

## 2019-09-18 NOTE — Discharge Instructions (Addendum)
Continue ice and buddy taping to immobilize fractured toe. Follow-up with the provider listed for further evaluation of toe injury. Regular activity with the exception of avoiding aggressive physical activity such as climbing or martial arts participation.  You may take Naproxen or ibuprofen as needed for pain.

## 2019-10-02 ENCOUNTER — Encounter: Payer: Self-pay | Admitting: Sports Medicine

## 2019-10-02 ENCOUNTER — Ambulatory Visit (INDEPENDENT_AMBULATORY_CARE_PROVIDER_SITE_OTHER): Payer: BC Managed Care – PPO | Admitting: Sports Medicine

## 2019-10-02 ENCOUNTER — Other Ambulatory Visit: Payer: Self-pay

## 2019-10-02 ENCOUNTER — Ambulatory Visit (INDEPENDENT_AMBULATORY_CARE_PROVIDER_SITE_OTHER): Payer: BC Managed Care – PPO

## 2019-10-02 DIAGNOSIS — S92401A Displaced unspecified fracture of right great toe, initial encounter for closed fracture: Secondary | ICD-10-CM | POA: Insufficient documentation

## 2019-10-02 DIAGNOSIS — S92414A Nondisplaced fracture of proximal phalanx of right great toe, initial encounter for closed fracture: Secondary | ICD-10-CM | POA: Diagnosis not present

## 2019-10-02 DIAGNOSIS — S92411A Displaced fracture of proximal phalanx of right great toe, initial encounter for closed fracture: Secondary | ICD-10-CM | POA: Diagnosis not present

## 2019-10-02 NOTE — Patient Instructions (Signed)
Get a Morton's plate from [employer]. Buddy tape toe at night. No Karate for 4 weeks and then return to see me in 6 weeks.

## 2019-10-02 NOTE — Assessment & Plan Note (Signed)
This is a pleasant 44 year old male, he practices karate. He kicked a board with his toe extended and had immediate pain, swelling, bruising 2 weeks ago. He was seen in urgent care where x-rays showed a transverse fracture through the right first proximal phalanx, he did have some rigid shoes and was not placed in a postop shoe which is fine. Toes were buddy taped, right now he is doing well, minimal pain, I answered several questions, repeating x-rays today, I do anticipate 6 to 8 weeks for full healing, he will purchase a Morton's plate through [employer] for immobilization, buddy tape the toe at night. Avoid karate for 4 more weeks and then return to see me in 6 weeks. No further x-rays needed after today.

## 2019-10-02 NOTE — Progress Notes (Signed)
    Procedures performed today:    None.  Independent interpretation of notes and tests performed by another provider:   X-rays personally viewed, there is a nondisplaced transverse fracture through the first proximal phalanx.  Brief History, Exam, Impression, and Recommendations:    Fracture of great toe, right, closed This is a pleasant 44 year old male, he practices karate. He kicked a board with his toe extended and had immediate pain, swelling, bruising 2 weeks ago. He was seen in urgent care where x-rays showed a transverse fracture through the right first proximal phalanx, he did have some rigid shoes and was not placed in a postop shoe which is fine. Toes were buddy taped, right now he is doing well, minimal pain, I answered several questions, repeating x-rays today, I do anticipate 6 to 8 weeks for full healing, he will purchase a Morton's plate through [employer] for immobilization, buddy tape the toe at night. Avoid karate for 4 more weeks and then return to see me in 6 weeks. No further x-rays needed after today.    ___________________________________________ Ihor Austin. Benjamin Stain, M.D., ABFM., CAQSM. Primary Care and Sports Medicine [facility]  Adjunct Instructor of Family Medicine  [employer]

## 2019-10-11 ENCOUNTER — Other Ambulatory Visit: Payer: Self-pay

## 2019-10-11 ENCOUNTER — Emergency Department
Admission: RE | Admit: 2019-10-11 | Discharge: 2019-10-11 | Disposition: A | Discharge status: Home / Self Care | Payer: BC Managed Care – PPO | Source: Ambulatory Visit

## 2019-10-11 ENCOUNTER — Emergency Department (INDEPENDENT_AMBULATORY_CARE_PROVIDER_SITE_OTHER): Payer: BC Managed Care – PPO

## 2019-10-11 VITALS — BP 144/93 | HR 98 | Temp 98.5°F | Resp 16 | Ht 70.0 in | Wt 178.0 lb

## 2019-10-11 DIAGNOSIS — M79671 Pain in right foot: Secondary | ICD-10-CM | POA: Diagnosis not present

## 2019-10-11 DIAGNOSIS — S92414D Nondisplaced fracture of proximal phalanx of right great toe, subsequent encounter for fracture with routine healing: Secondary | ICD-10-CM

## 2019-10-11 DIAGNOSIS — S92411D Displaced fracture of proximal phalanx of right great toe, subsequent encounter for fracture with routine healing: Secondary | ICD-10-CM | POA: Diagnosis not present

## 2019-10-11 NOTE — ED Provider Notes (Signed)
[facility]    CSN: [id number] Arrival date & time: 10/11/19  1350      History   Chief Complaint Chief Complaint  Patient presents with  . Foot Pain    HPI Hayden Patrick is a 44 y.o. male.   HPI  Hayden Patrick is a 44 y.o. male presenting to UC with c/o gradually worsening Right foot pain after inserting a carbon fiber insert into his boot 5 days ago as recommended by Dr. Benjamin Stain, Sports Medicine, for treatment of a great toe fracture, dx on 10/19/19.  Pt is concerned he is still having pain and has also noticed a faint bruise at the base of his 3rd and 4th toe. He is not sure if he has another injury in addition to the initial great toe fracture.     Past Medical History:  Diagnosis Date  . DEPRESSION 08/16/2007   Qualifier: Diagnosis of  By: Thomos Lemons    . Inguinal hernia bilateral    Patient Active Problem List   Diagnosis Date Noted  . Fracture of great toe, right, closed 10/02/2019  . Essential hypertension 05/02/2014  . Hyperlipidemia 02/07/2013  . Ventral hernia 02/06/2013  . Right groin pain 12/07/2010  . TINEA VERSICOLOR 09/03/2009    Past Surgical History:  Procedure Laterality Date  . HERNIA REPAIR  1990, 2002   LIH  . left inguinal hernia x 2    . TONSILLECTOMY         Home Medications    Prior to Admission medications   Medication Sig Start Date End Date Taking? Authorizing Provider  ibuprofen (ADVIL) 200 MG tablet Take three tablets ( 600 milligrams total) every 8 with food for pain, for 5 days 08/17/18  Yes Lajean Manes, MD    Family History Family History  Problem Relation Age of Onset  . Hypertension Father   . Heart failure Father   . Hyperlipidemia Father   . Heart attack Neg Hx   . Diabetes Neg Hx   . Sudden death Neg Hx     Social History Social History   Tobacco Use  . Smoking status: Never Smoker  . Smokeless tobacco: Never Used  Vaping Use  . Vaping Use: Never used  Substance Use Topics  .  Alcohol use: Yes  . Drug use: No     Allergies   Patient has no known allergies.   Review of Systems Review of Systems  Musculoskeletal: Positive for arthralgias and joint swelling.  Skin: Positive for color change. Negative for wound.     Physical Exam Triage Vital Signs ED Triage Vitals  Enc Vitals Group     BP 10/11/19 1436 (!) 144/93     Pulse Rate 10/11/19 1436 98     Resp 10/11/19 1436 16     Temp 10/11/19 1436 98.5 F (36.9 C)     Temp Source 10/11/19 1436 Oral     SpO2 10/11/19 1436 99 %     Weight 10/11/19 1439 178 lb (80.7 kg)     Height 10/11/19 1439 5\' 10"  (1.778 m)     Head Circumference --      Peak Flow --      Pain Score 10/11/19 1438 5     Pain Loc --      Pain Edu? --      Excl. in GC? --    No data found.  Updated Vital Signs BP (!) 144/93 (BP Location: Right Arm)   Pulse  98   Temp 98.5 F (36.9 C) (Oral)   Resp 16   Ht 5\' 10"  (1.778 m)   Wt 178 lb (80.7 kg)   SpO2 99%   BMI 25.54 kg/m   Visual Acuity Right Eye Distance:   Left Eye Distance:   Bilateral Distance:    Right Eye Near:   Left Eye Near:    Bilateral Near:     Physical Exam Vitals and nursing note reviewed.  Constitutional:      Appearance: Normal appearance. He is well-developed.  HENT:     Head: Normocephalic and atraumatic.  Cardiovascular:     Rate and Rhythm: Normal rate.  Pulmonary:     Effort: Pulmonary effort is normal.  Musculoskeletal:        General: Tenderness present. No swelling. Normal range of motion.     Cervical back: Normal range of motion.       Feet:  Skin:    General: Skin is warm and dry.     Capillary Refill: Capillary refill takes less than 2 seconds.  Neurological:     Mental Status: He is alert and oriented to person, place, and time.     Sensory: No sensory deficit.  Psychiatric:        Behavior: Behavior normal.      UC Treatments / Results  Labs (all labs ordered are listed, but only abnormal results are displayed) Labs  Reviewed - No data to display  EKG   Radiology DG Foot Complete Right  Result Date: 10/11/2019 CLINICAL DATA:  Right great toe fracture continued pain in third and fourth MTP joints with bruising EXAM: RIGHT FOOT COMPLETE - 3+ VIEW COMPARISON:  10/02/2019, 09/18/2019 FINDINGS: No change in alignment of the mildly comminuted fracture involving the proximal phalanx of the first digit. Fracture line remains visible medially. Some bony bridging consistent with healing response. No additional fracture is seen IMPRESSION: Healing fracture of the proximal phalanx of the first digit without change in alignment. Electronically Signed   By: 09/20/2019 M.D.   On: 10/11/2019 16:01    Procedures Procedures (including critical care time)  Medications Ordered in UC Medications - No data to display  Initial Impression / Assessment and Plan / UC Course  I have reviewed the triage vital signs and the nursing notes.  Pertinent labs & imaging results that were available during my care of the patient were reviewed by me and considered in my medical decision making (see chart for details).     Reassured pt of routine healing, no additional imjuries. Pt would like to try post-op shoe instead of the boot he started to wear again Encouraged to f/u with Dr. 12/11/2019 AVS given  Final Clinical Impressions(s) / UC Diagnoses   Final diagnoses:  Right foot pain  Closed nondisplaced fracture of proximal phalanx of right great toe with routine healing, subsequent encounter     Discharge Instructions      Call to schedule follow up with Dr. Benjamin Stain in 1-2 weeks if still having concerns, or follow up in 1 month with him as previously scheduled.  You may try tylenol and ibuprofen and apply a cool compress 2-3 times daily to help with discomfort.     ED Prescriptions    None     PDMP not reviewed this encounter.   Benjamin Stain, Lurene Shadow 10/11/19 1637

## 2019-10-11 NOTE — ED Triage Notes (Signed)
Return for continued pain to right foot - pt has seen Dr T, had pt place a carbon fiber insert  his shoe on Monday - pain has increased to right foot since then No OTC meds - per pt he does not like to take meds Moderna vaccine 4/21

## 2019-10-11 NOTE — Discharge Instructions (Signed)
°  Call to schedule follow up with Dr. Benjamin Stain in 1-2 weeks if still having concerns, or follow up in 1 month with him as previously scheduled.  You may try tylenol and ibuprofen and apply a cool compress 2-3 times daily to help with discomfort.

## 2019-11-11 ENCOUNTER — Ambulatory Visit (INDEPENDENT_AMBULATORY_CARE_PROVIDER_SITE_OTHER): Payer: BC Managed Care – PPO

## 2019-11-11 ENCOUNTER — Other Ambulatory Visit: Payer: Self-pay

## 2019-11-11 ENCOUNTER — Ambulatory Visit (INDEPENDENT_AMBULATORY_CARE_PROVIDER_SITE_OTHER): Payer: BC Managed Care – PPO | Admitting: Sports Medicine

## 2019-11-11 DIAGNOSIS — S92414D Nondisplaced fracture of proximal phalanx of right great toe, subsequent encounter for fracture with routine healing: Secondary | ICD-10-CM

## 2019-11-11 NOTE — Progress Notes (Signed)
    Procedures performed today:    None.  Independent interpretation of notes and tests performed by another provider:   None.  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 2 months ago he kicked a board, ultimately x-rays showed a transverse fracture through his right first proximal phalanx, he did well in a postop shoe and returns today completely pain-free. We will do a final repeat x-ray today, though I do think it is okay for him to return to karate. He will continue to focus on good form. Return as needed.    ___________________________________________ Ihor Austin. Benjamin Stain, M.D., ABFM., CAQSM. Primary Care and Sports Medicine [facility]  Adjunct Instructor of Family Medicine  [employer]

## 2019-11-11 NOTE — Assessment & Plan Note (Signed)
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 2 months ago he kicked a board, ultimately x-rays showed a transverse fracture through his right first proximal phalanx, he did well in a postop shoe and returns today completely pain-free. We will do a final repeat x-ray today, though I do think it is okay for him to return to karate. He will continue to focus on good form. Return as needed.

## 2019-11-13 ENCOUNTER — Ambulatory Visit: Payer: BC Managed Care – PPO | Admitting: Sports Medicine

## 2019-12-18 DIAGNOSIS — L304 Erythema intertrigo: Secondary | ICD-10-CM | POA: Diagnosis not present

## 2020-01-03 DIAGNOSIS — U071 COVID-19: Secondary | ICD-10-CM | POA: Diagnosis not present

## 2020-01-03 DIAGNOSIS — E663 Overweight: Secondary | ICD-10-CM | POA: Diagnosis not present

## 2020-02-04 ENCOUNTER — Other Ambulatory Visit: Payer: BC Managed Care – PPO

## 2020-02-04 ENCOUNTER — Other Ambulatory Visit: Payer: Self-pay

## 2020-02-04 DIAGNOSIS — Z20822 Contact with and (suspected) exposure to covid-19: Secondary | ICD-10-CM

## 2020-02-06 LAB — SARS-COV-2, NAA 2 DAY TAT

## 2020-02-06 LAB — NOVEL CORONAVIRUS, NAA: SARS-CoV-2, NAA: NOT DETECTED

## 2020-07-07 DIAGNOSIS — M79652 Pain in left thigh: Secondary | ICD-10-CM | POA: Diagnosis not present

## 2020-07-07 DIAGNOSIS — M5451 Vertebrogenic low back pain: Secondary | ICD-10-CM | POA: Diagnosis not present

## 2020-07-13 DIAGNOSIS — M79652 Pain in left thigh: Secondary | ICD-10-CM | POA: Diagnosis not present

## 2020-07-13 DIAGNOSIS — M5451 Vertebrogenic low back pain: Secondary | ICD-10-CM | POA: Diagnosis not present

## 2020-07-15 ENCOUNTER — Other Ambulatory Visit: Payer: Self-pay

## 2020-07-15 ENCOUNTER — Emergency Department
Admission: RE | Admit: 2020-07-15 | Discharge: 2020-07-15 | Disposition: A | Discharge status: Home / Self Care | Payer: BC Managed Care – PPO | Source: Ambulatory Visit

## 2020-07-15 VITALS — BP 136/91 | HR 104 | Temp 97.7°F | Resp 20 | Ht 71.0 in | Wt 180.0 lb

## 2020-07-15 DIAGNOSIS — M25552 Pain in left hip: Secondary | ICD-10-CM

## 2020-07-15 HISTORY — DX: Dorsalgia, unspecified: M54.9

## 2020-07-15 NOTE — ED Triage Notes (Addendum)
Pt presents to Urgent Care with c/o lower back, L hip, and L groan pain. Reports he has suffered from this intermittently for several years and has been in PT due to these problems. States he has also had hernia repairs (left side), and he wants to "rule out any other medical causes for this pain."

## 2020-07-15 NOTE — ED Provider Notes (Signed)
[name]    CSN: [id number] Arrival date & time: 07/15/20  0948      History   Chief Complaint Chief Complaint  Patient presents with   Back Pain   Hip Pain   Groin Pain    HPI Hayden Patrick is a 45 y.o. male.   Patient presents with concerns of left low back, hip, and medial thigh pain. He has history of left inguinal hernia repair x2 with mesh placed during the second repair. He states since then he has had intermittent issues with left groin pain. He denies any known injury or preceding activity but does work out. His current flare began a couple weeks ago and seems to be getting worse/more constant. He describes tightness in his left low back, the side and front of his hip, and his medial thigh as well as occasionally around his left side just above his hip. He has been to physical therapy off and on for this and restarted with PT recently who told him his pelvis is not level due to muscular tightness and weakness in his QL, hip flexors, and adductors. He wants to make sure there isn't anything else, including repeat hernia, causing the pain before he continues with PT. He denies any urinary symptoms, testicular pain or swelling, abdominal pain, n/v/d/c, radiating pain, or numbness/tingling.   The history is provided by the patient.  Back Pain Associated symptoms: no abdominal pain, no chest pain, no dysuria, no fever, no headaches, no numbness and no weakness   Hip Pain Pertinent negatives include no chest pain, no abdominal pain and no headaches.  Groin Pain Pertinent negatives include no chest pain, no abdominal pain and no headaches.   Past Medical History:  Diagnosis Date   Back pain    DEPRESSION 08/16/2007   Qualifier: Diagnosis of  By: Cathey Endow DO, Karen     Inguinal hernia bilateral    Patient Active Problem List   Diagnosis Date Noted   Fracture of great toe, right, closed 10/02/2019   Essential hypertension 05/02/2014   Hyperlipidemia 02/07/2013    Ventral hernia 02/06/2013   Right groin pain 12/07/2010   TINEA VERSICOLOR 09/03/2009    Past Surgical History:  Procedure Laterality Date   HERNIA REPAIR  1990, 2002   [facility]   left inguinal hernia x 2     TONSILLECTOMY         Home Medications    Prior to Admission medications   Medication Sig Start Date End Date Taking? Authorizing Provider  loratadine (CLARITIN) 10 MG tablet Take 10 mg by mouth daily.   Yes [provider]  ibuprofen (ADVIL) 200 MG tablet Take three tablets ( 600 milligrams total) every 8 with food for pain, for 5 days 08/17/18   Lajean Manes, MD    Family History Family History  Problem Relation Age of Onset   Arthritis Mother    Hypertension Father    Heart failure Father    Hyperlipidemia Father    Heart attack Neg Hx    Diabetes Neg Hx    Sudden death Neg Hx     Social History Social History   Tobacco Use   Smoking status: Never   Smokeless tobacco: Never  Vaping Use   Vaping Use: Never used  Substance Use Topics   Alcohol use: Yes    Comment: occasionally   Drug use: No     Allergies   Patient has no known allergies.   Review of Systems Review  of Systems  Constitutional:  Negative for fatigue and fever.  Cardiovascular:  Negative for chest pain and leg swelling.  Gastrointestinal:  Negative for abdominal pain, blood in stool, constipation, diarrhea, nausea and vomiting.  Genitourinary:  Negative for difficulty urinating, dysuria, flank pain, frequency, penile pain, scrotal swelling and testicular pain.  Musculoskeletal:  Positive for back pain and myalgias. Negative for neck pain.  Skin:  Negative for rash and wound.  Neurological:  Negative for dizziness, weakness, numbness and headaches.    Physical Exam Triage Vital Signs ED Triage Vitals  Enc Vitals Group     BP 07/15/20 1006 (!) 148/104     Pulse Rate 07/15/20 1010 (!) 104     Resp 07/15/20 1010 20     Temp 07/15/20 1010 97.7 F (36.5 C)     Temp Source  07/15/20 1010 Oral     SpO2 07/15/20 1010 96 %     Weight 07/15/20 1004 180 lb (81.6 kg)     Height 07/15/20 1004 5\' 11"  (1.803 m)     Head Circumference --      Peak Flow --      Pain Score 07/15/20 1003 5     Pain Loc --      Pain Edu? --      Excl. in GC? --    No data found.  Updated Vital Signs BP (!) 136/91   Pulse (!) 104   Temp 97.7 F (36.5 C) (Oral)   Resp 20   Ht 5\' 11"  (1.803 m)   Wt 180 lb (81.6 kg)   SpO2 96%   BMI 25.10 kg/m   Visual Acuity Right Eye Distance:   Left Eye Distance:   Bilateral Distance:    Right Eye Near:   Left Eye Near:    Bilateral Near:     Physical Exam Vitals and nursing note reviewed. Exam conducted with a chaperone present.  Constitutional:      General: He is not in acute distress. HENT:     Head: Normocephalic.  Eyes:     Pupils: Pupils are equal, round, and reactive to light.  Cardiovascular:     Rate and Rhythm: Normal rate and regular rhythm.     Heart sounds: Normal heart sounds.  Pulmonary:     Effort: Pulmonary effort is normal.     Breath sounds: Normal breath sounds.  Abdominal:     Palpations: Abdomen is soft.     Tenderness: There is no abdominal tenderness. There is no right CVA tenderness, left CVA tenderness or guarding.     Hernia: No hernia is present. There is no hernia in the left inguinal area.  Musculoskeletal:     Lumbar back: Spasms present. No tenderness. Decreased range of motion. Negative right straight leg raise test and negative left straight leg raise test.     Left hip: No tenderness. Normal range of motion. Normal strength.     Left upper leg: No swelling or tenderness.     Comments: No notable tenderness to lumbar region, left hip (lateral, anterior), or into left thigh. Full ROM.   Skin:    General: Skin is warm.     Findings: No rash.  Neurological:     General: No focal deficit present.     Mental Status: He is alert.  Psychiatric:        Mood and Affect: Mood is anxious.      UC Treatments / Results  Labs (all labs ordered are listed,  but only abnormal results are displayed) Labs Reviewed - No data to display  EKG   Radiology No results found.  Procedures Procedures (including critical care time)  Medications Ordered in UC Medications - No data to display  Initial Impression / Assessment and Plan / UC Course  I have reviewed the triage vital signs and the nursing notes.  Pertinent labs & imaging results that were available during my care of the patient were reviewed by me and considered in my medical decision making (see chart for details).     No indication of repeat hernia or other cause beyond MSK. Continue PT. Pt declined Rx, will take ibuprofen if needed. Encouraged fu with ortho given recurrent issues, and possibly general surgery to rule out relation to surgical mesh.   E/M: 1 acute uncomplicated illness, no data, low risk   Final Clinical Impressions(s) / UC Diagnoses   Final diagnoses:  Left hip pain     Discharge Instructions      No signs of recurrent hernia or other cause besides muscular. Continue with PT and take ibuprofen as directed as needed. Recommend seeing orthopedics or possibly general surgeon given recurrent issues and history of hernia repair with mesh.      ED Prescriptions   None    PDMP not reviewed this encounter.   Estanislado Pandy, [place] 07/15/20 1046

## 2020-07-15 NOTE — Discharge Instructions (Addendum)
No signs of recurrent hernia or other cause besides muscular. Continue with PT and take ibuprofen as directed as needed. Recommend seeing orthopedics or possibly general surgeon given recurrent issues and history of hernia repair with mesh.

## 2020-07-16 DIAGNOSIS — M5451 Vertebrogenic low back pain: Secondary | ICD-10-CM | POA: Diagnosis not present

## 2020-07-16 DIAGNOSIS — M79652 Pain in left thigh: Secondary | ICD-10-CM | POA: Diagnosis not present

## 2020-07-29 DIAGNOSIS — M79652 Pain in left thigh: Secondary | ICD-10-CM | POA: Diagnosis not present

## 2020-07-29 DIAGNOSIS — M5451 Vertebrogenic low back pain: Secondary | ICD-10-CM | POA: Diagnosis not present

## 2020-07-29 DIAGNOSIS — Z20822 Contact with and (suspected) exposure to covid-19: Secondary | ICD-10-CM | POA: Diagnosis not present

## 2020-08-05 IMAGING — DX DG HIP (WITH OR WITHOUT PELVIS) 2-3V RIGHT
3 series · 3 of 3 positions shown · non-contrast
Comparison: None.
COMPARISON: None.

Addendum:
CLINICAL DATA: Right hip pain

EXAM:
DG HIP (WITH OR WITHOUT PELVIS) 2-3V RIGHT

[pelvis ap]
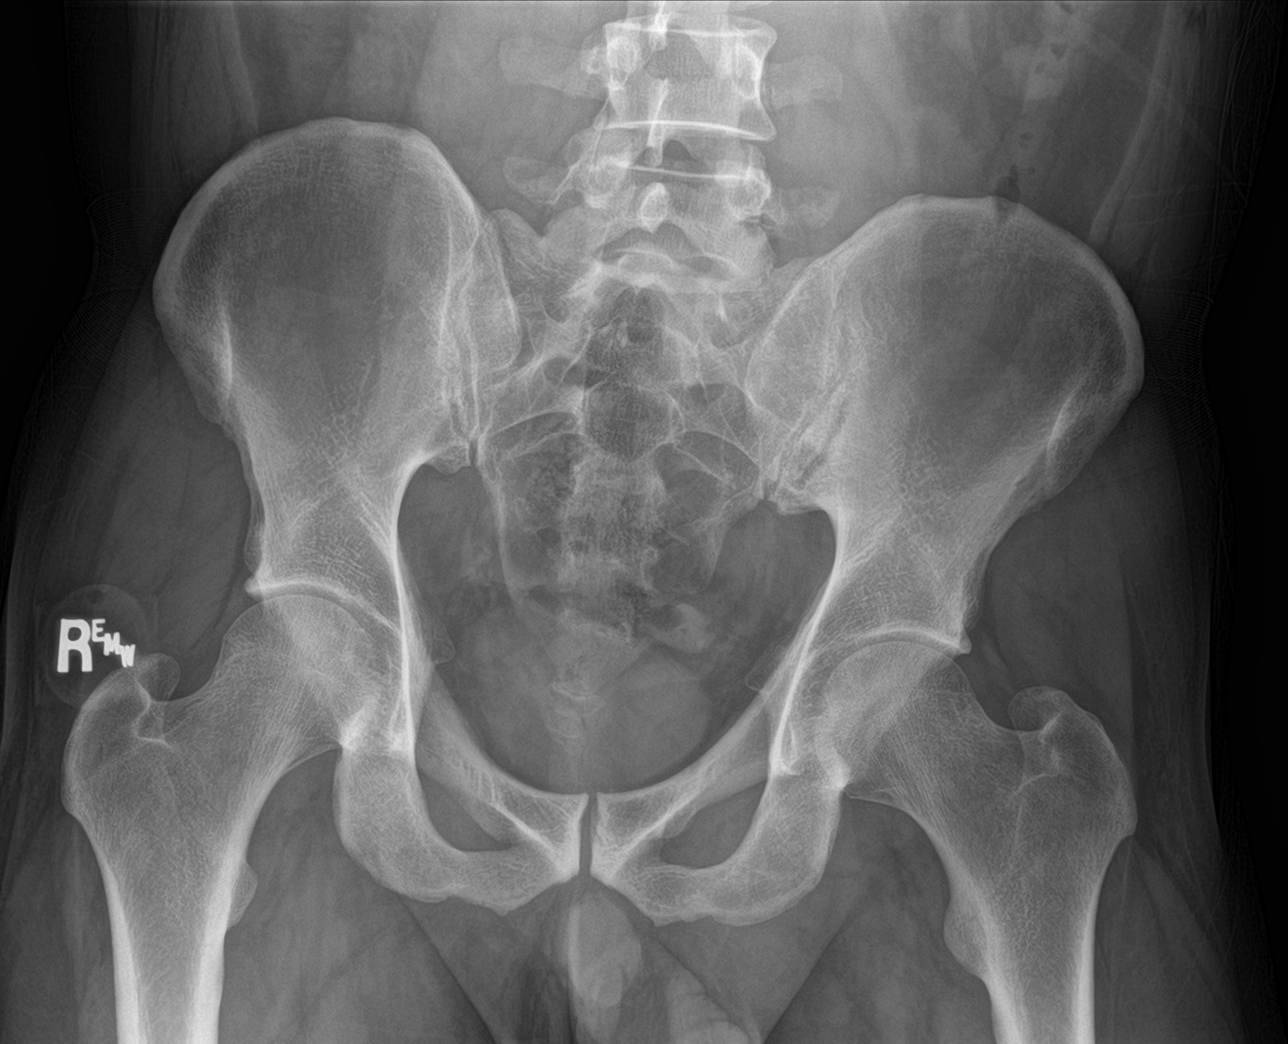

[hip ap]
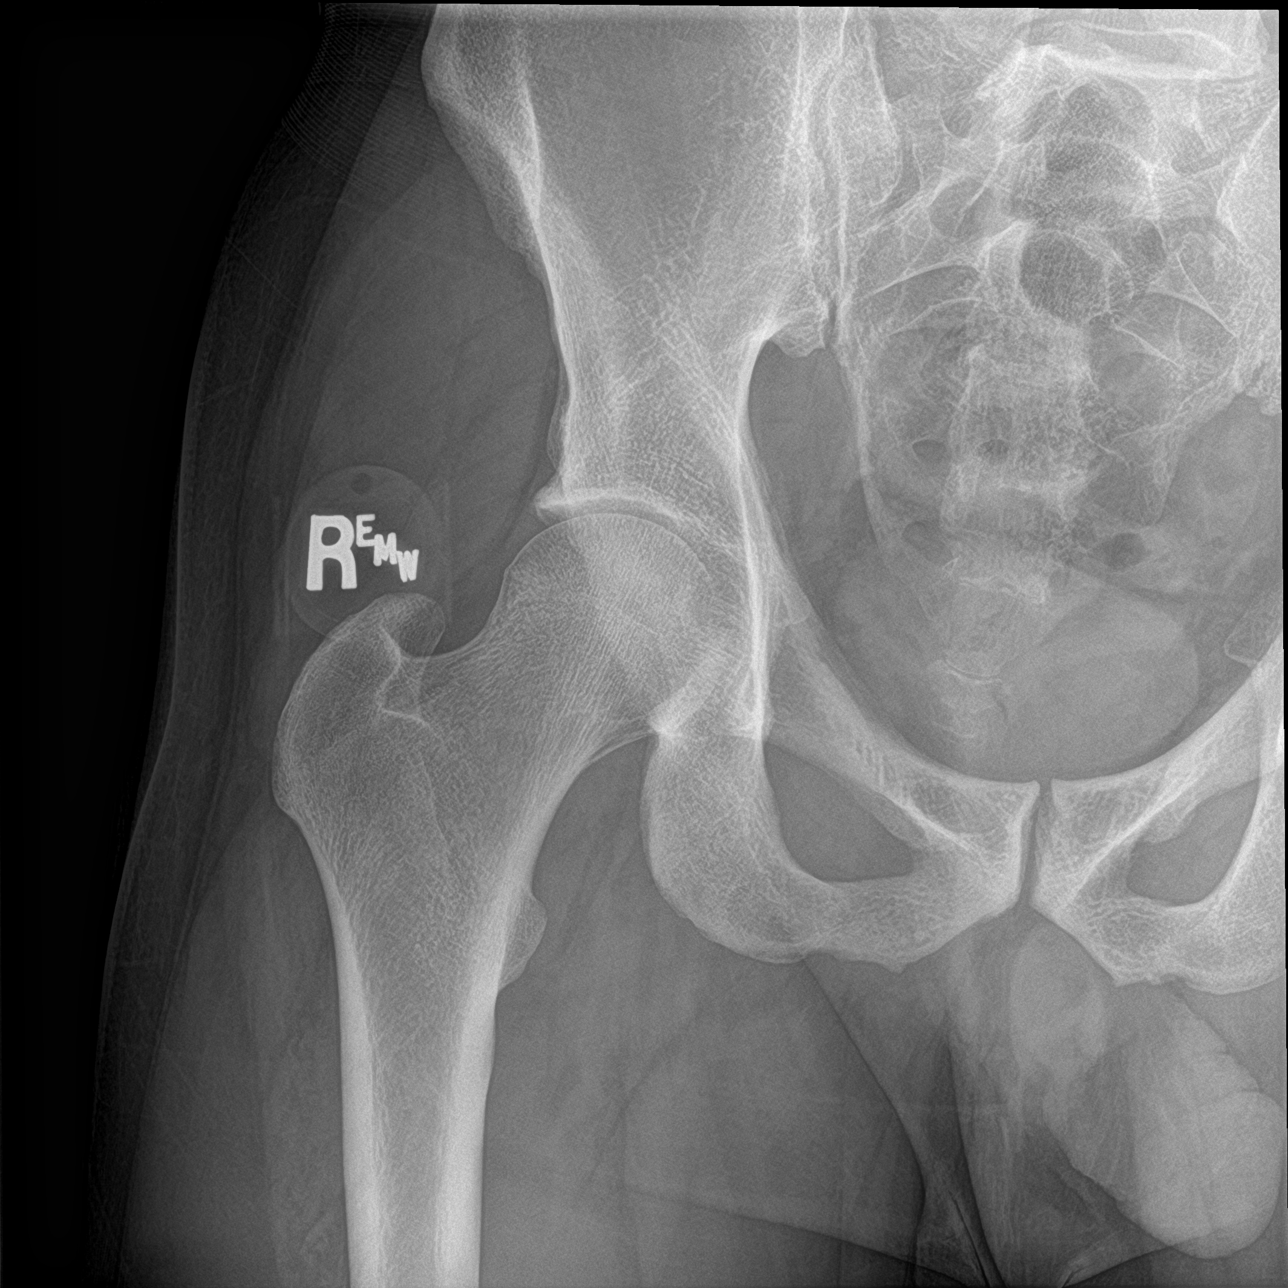

[hip lat]
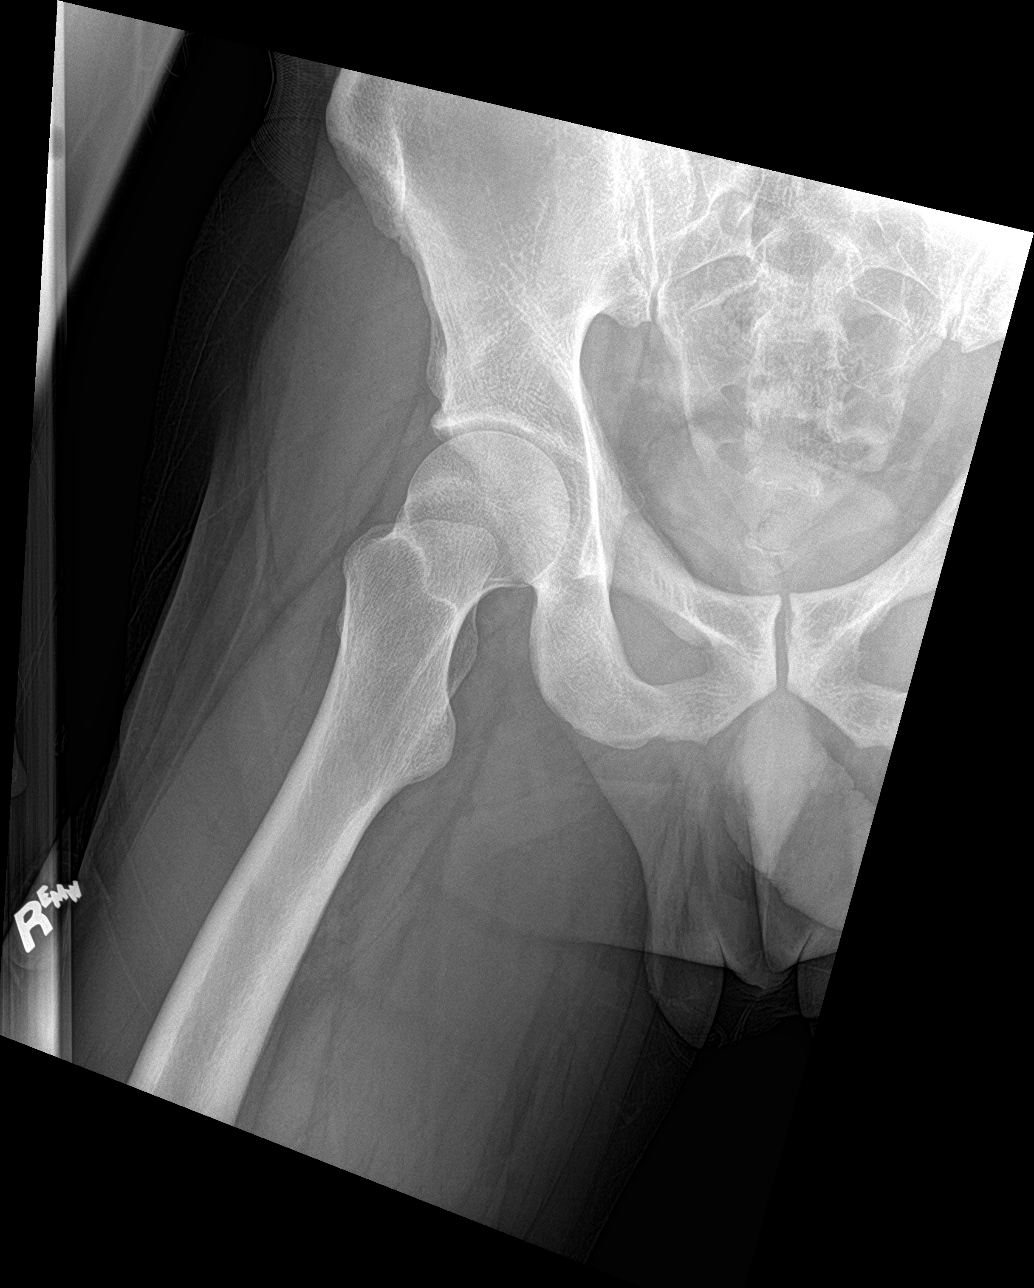

[3 of 3 positions shown; findings below may reference images not displayed]

FINDINGS: There is no evidence of hip fracture or dislocation. There could be
slight over coverage of the bilateral femoral heads from the
anterior acetabulum. There is no evidence of arthropathy or other
focal bone abnormality.
IMPRESSION: Findings which could be suggestive of bilateral mild CAM type
deformity.

ADDENDUM:
Impression should read: Findings which could be suggestive of
bilateral mild femoroacetabular impingement.

*** End of Addendum ***
FINDINGS: There is no evidence of hip fracture or dislocation. There could be
slight over coverage of the bilateral femoral heads from the
anterior acetabulum. There is no evidence of arthropathy or other
focal bone abnormality.
IMPRESSION: Findings which could be suggestive of bilateral mild CAM type
deformity.

## 2020-08-12 DIAGNOSIS — M79652 Pain in left thigh: Secondary | ICD-10-CM | POA: Diagnosis not present

## 2020-08-12 DIAGNOSIS — M5451 Vertebrogenic low back pain: Secondary | ICD-10-CM | POA: Diagnosis not present

## 2020-08-18 DIAGNOSIS — M5451 Vertebrogenic low back pain: Secondary | ICD-10-CM | POA: Diagnosis not present

## 2020-08-18 DIAGNOSIS — M79652 Pain in left thigh: Secondary | ICD-10-CM | POA: Diagnosis not present

## 2020-11-11 DIAGNOSIS — Z23 Encounter for immunization: Secondary | ICD-10-CM | POA: Diagnosis not present

## 2020-12-18 ENCOUNTER — Emergency Department (INDEPENDENT_AMBULATORY_CARE_PROVIDER_SITE_OTHER): Payer: BC Managed Care – PPO

## 2020-12-18 ENCOUNTER — Other Ambulatory Visit: Payer: Self-pay

## 2020-12-18 ENCOUNTER — Emergency Department
Admission: RE | Admit: 2020-12-18 | Discharge: 2020-12-18 | Disposition: A | Discharge status: Home / Self Care | Payer: BC Managed Care – PPO | Source: Ambulatory Visit

## 2020-12-18 VITALS — BP 146/92 | HR 98 | Resp 16

## 2020-12-18 DIAGNOSIS — M79674 Pain in right toe(s): Secondary | ICD-10-CM

## 2020-12-18 DIAGNOSIS — M7989 Other specified soft tissue disorders: Secondary | ICD-10-CM | POA: Diagnosis not present

## 2020-12-18 NOTE — ED Provider Notes (Signed)
[name]    CSN: [id number] Arrival date & time: 12/18/20  0844      History   Chief Complaint Chief Complaint  Patient presents with   Toe Injury    Right great toe     HPI Hayden Patrick is a 45 y.o. male.   HPI 45 year old male presents with right great toe pain since last night.  Patient reports a metal folding chair falling onto his right great toe in which he has had previous fracture.  Patient is currently wearing postop shoe and has buddy taped his right great toe to second toe.  Past Medical History:  Diagnosis Date   Back pain    DEPRESSION 08/16/2007   Qualifier: Diagnosis of  By: Cathey Endow DO, Karen     Inguinal hernia bilateral    Patient Active Problem List   Diagnosis Date Noted   Fracture of great toe, right, closed 10/02/2019   Essential hypertension 05/02/2014   Hyperlipidemia 02/07/2013   Ventral hernia 02/06/2013   Right groin pain 12/07/2010   TINEA VERSICOLOR 09/03/2009    Past Surgical History:  Procedure Laterality Date   HERNIA REPAIR  1990, 2002   [facility]   left inguinal hernia x 2     TONSILLECTOMY         Home Medications    Prior to Admission medications   Not on File    Family History Family History  Problem Relation Age of Onset   Arthritis Mother    Hypertension Father    Heart failure Father    Hyperlipidemia Father    Heart attack Neg Hx    Diabetes Neg Hx    Sudden death Neg Hx     Social History Social History   Tobacco Use   Smoking status: Never   Smokeless tobacco: Never  Vaping Use   Vaping Use: Never used  Substance Use Topics   Alcohol use: Yes    Comment: occasionally   Drug use: No     Allergies   Patient has no known allergies.   Review of Systems Review of Systems  Musculoskeletal:        Right great toe pain x1 day  All other systems reviewed and are negative.   Physical Exam Triage Vital Signs ED Triage Vitals  Enc Vitals Group     BP      Pulse      Resp       Temp      Temp src      SpO2      Weight      Height      Head Circumference      Peak Flow      Pain Score      Pain Loc      Pain Edu?      Excl. in GC?    No data found.  Updated Vital Signs BP (!) 146/92 (BP Location: Right Arm)   Pulse 98   Resp 16   SpO2 98%    Physical Exam Vitals reviewed.  Constitutional:      Appearance: Normal appearance. He is normal weight.  HENT:     Head: Normocephalic and atraumatic.     Nose: Nose normal.     Mouth/Throat:     Mouth: Mucous membranes are moist.     Pharynx: Oropharynx is clear.  Eyes:     Extraocular Movements: Extraocular movements intact.     Conjunctiva/sclera: Conjunctivae normal.  Pupils: Pupils are equal, round, and reactive to light.  Cardiovascular:     Rate and Rhythm: Normal rate and regular rhythm.     Pulses: Normal pulses.  Pulmonary:     Effort: Pulmonary effort is normal.     Breath sounds: Normal breath sounds.  Musculoskeletal:        General: Normal range of motion.     Cervical back: Normal range of motion and neck supple.     Comments: Right great toe: TTP over proximal phalanx, LROM, no soft tissue swelling, no deformity noted  Skin:    General: Skin is warm and dry.  Neurological:     General: No focal deficit present.     Mental Status: He is alert and oriented to person, place, and time.     UC Treatments / Results  Labs (all labs ordered are listed, but only abnormal results are displayed) Labs Reviewed - No data to display  EKG   Radiology DG Foot Complete Right  Result Date: 12/18/2020 CLINICAL DATA:  Right great toe pain EXAM: RIGHT FOOT COMPLETE - 3+ VIEW COMPARISON:  November 11, 2019. FINDINGS: The prior great toe fracture appears healed. No evidence of new/interval acute fracture. No joint malalignment. Soft tissue swelling about the great toe. IMPRESSION: 1. The prior great toe fracture appears healed. No evidence of new/interval acute fracture. 2. Soft tissue  swelling about the great toe. Electronically Signed   By: Feliberto Harts M.D.   On: 12/18/2020 09:23    Procedures Procedures (including critical care time)  Medications Ordered in UC Medications - No data to display  Initial Impression / Assessment and Plan / UC Course  I have reviewed the triage vital signs and the nursing notes.  Pertinent labs & imaging results that were available during my care of the patient were reviewed by me and considered in my medical decision making (see chart for details).     MDM: 1. Right Great Toe pain-right foot x-ray reveals (above) Advised patient to RICE right great toe 20-25 minutes 2-3 times daily for the next 3 days.  Advised patient may use OTC ibuprofen 600 to 800 mg 1-2 times daily, as needed for the next several days.  Advised patient if great toe pain worsens and/or unresolved please follow-up with [facility]opedic provider (contact information provided above).  Patient discharged home, hemodynamically stable. Final Clinical Impressions(s) / UC Diagnoses   Final diagnoses:  Great toe pain, right   Discharge Instructions   None    ED Prescriptions   None    PDMP not reviewed this encounter.   Trevor Iha, FNP 12/18/20 1004

## 2020-12-18 NOTE — Discharge Instructions (Addendum)
Advised patient to RICE right great toe 20-25 minutes 2-3 times daily for the next 3 days.  Advised patient may use OTC ibuprofen 600 to 800 mg 1-2 times daily, as needed for the next several days.  Advised patient if great toe pain worsens and/or unresolved please follow-up with [facility]opedic provider (contact information provided above).

## 2020-12-18 NOTE — ED Triage Notes (Signed)
Patient reports a metal folding chair fell over onto his right great toe last night. Previous fracture to same toe. He has a post op shoe he's wearing and buddy taped.

## 2021-05-13 ENCOUNTER — Emergency Department
Admission: RE | Admit: 2021-05-13 | Discharge: 2021-05-13 | Disposition: A | Discharge status: Home / Self Care | Payer: BC Managed Care – PPO | Source: Ambulatory Visit

## 2021-05-13 ENCOUNTER — Emergency Department (INDEPENDENT_AMBULATORY_CARE_PROVIDER_SITE_OTHER): Payer: BC Managed Care – PPO

## 2021-05-13 VITALS — BP 149/92 | HR 87 | Temp 98.6°F | Resp 14

## 2021-05-13 DIAGNOSIS — R0789 Other chest pain: Secondary | ICD-10-CM

## 2021-05-13 DIAGNOSIS — R109 Unspecified abdominal pain: Secondary | ICD-10-CM

## 2021-05-13 LAB — POCT URINALYSIS DIP (MANUAL ENTRY)
Bilirubin, UA: NEGATIVE
Blood, UA: NEGATIVE
Glucose, UA: NEGATIVE mg/dL
Ketones, POC UA: NEGATIVE mg/dL
Leukocytes, UA: NEGATIVE
Nitrite, UA: NEGATIVE
Protein Ur, POC: NEGATIVE mg/dL
Spec Grav, UA: 1.02 (ref 1.010–1.025)
Urobilinogen, UA: 0.2 E.U./dL
pH, UA: 6.5 (ref 5.0–8.0)

## 2021-05-13 MED ORDER — CELECOXIB 100 MG PO CAPS: 100.0000 mg | ORAL_CAPSULE | Freq: Two times a day (BID) | ORAL | 0 refills | Status: AC

## 2021-05-13 NOTE — ED Triage Notes (Signed)
Pt presents with left sided pain that alternates between his left lower rib, left back, left hip, and left lower axilla. Discomfort began 3 weeks ago. Pt also endorses increasing fatigue.  ?

## 2021-05-13 NOTE — Discharge Instructions (Addendum)
Advised/informed patient of chest x-ray results-no acute cardiopulmonary process noted, no abnormalities.  Advised patient will treat empirically for costochondritis for the next 2 weeks with Celebrex.  Advised patient we will follow-up with him tomorrow, Friday, 05/14/2021 with lab results.  Advised patient if symptoms worsen and or unresolved please follow-up with PCP or here for further evaluation. ?

## 2021-05-13 NOTE — ED Provider Notes (Signed)
?[facility] ? ? ? ?CSN: [id number] ?Arrival date & time: 05/13/21  1452 ? ? ?  ? ?History   ?Chief Complaint ?Chief Complaint  ?Patient presents with  ? Flank Pain  ?  Entered by patient  ? ? ?HPI ?Hayden Patrick is a 46 y.o. male.  ? ?HPI 46 year old male presents with sided pain that alternates between his left lower rib and left back, left hip, and left lower axilla.  Discomfort began 3 weeks ago.  Additionally, patient complains of fatigue.  Patient is currently followed by PCP with no recent baseline serological test noted.  Last BMP and lipid panel was in 2015, TSH in 2012.  PMH significant for HTN and HLD. ? ?Past Medical History:  ?Diagnosis Date  ? Back pain   ? DEPRESSION 08/16/2007  ? Qualifier: Diagnosis of  By: Thomos Lemons    ? Inguinal hernia bilateral  ? ? ?Patient Active Problem List  ? Diagnosis Date Noted  ? Fracture of great toe, right, closed 10/02/2019  ? Essential hypertension 05/02/2014  ? Hyperlipidemia 02/07/2013  ? Ventral hernia 02/06/2013  ? Right groin pain 12/07/2010  ? TINEA VERSICOLOR 09/03/2009  ? ? ?Past Surgical History:  ?Procedure Laterality Date  ? HERNIA REPAIR  1990, 2002  ? [facility]  ? left inguinal hernia x 2    ? TONSILLECTOMY    ? ? ? ? ? ?Home Medications   ? ?Prior to Admission medications   ?Medication Sig Start Date End Date Taking? Authorizing Provider  ?celecoxib (CELEBREX) 100 MG capsule Take 1 capsule (100 mg total) by mouth 2 (two) times daily for 15 days. 05/13/21 05/28/21 Yes Trevor Iha, FNP  ? ? ?Family History ?Family History  ?Problem Relation Age of Onset  ? Arthritis Mother   ? Hypertension Father   ? Heart failure Father   ? Hyperlipidemia Father   ? Heart attack Neg Hx   ? Diabetes Neg Hx   ? Sudden death Neg Hx   ? ? ?Social History ?Social History  ? ?Tobacco Use  ? Smoking status: Never  ? Smokeless tobacco: Never  ?Vaping Use  ? Vaping Use: Never used  ?Substance Use Topics  ? Alcohol use: Yes  ?  Comment: occasionally  ? Drug use: No   ? ? ? ?Allergies   ?Patient has no known allergies. ? ? ?Review of Systems ?Review of Systems  ?Musculoskeletal:   ?     Patient reports left-sided rib pain that alternates between left lower back, left hip, and left axilla for 3 weeks.  ?All other systems reviewed and are negative. ? ? ?Physical Exam ?Triage Vital Signs ?ED Triage Vitals  ?Enc Vitals Group  ?   BP 05/13/21 1501 (!) 149/92  ?   Pulse Rate 05/13/21 1501 87  ?   Resp 05/13/21 1501 14  ?   Temp 05/13/21 1501 98.6 ?F (37 ?C)  ?   Temp Source 05/13/21 1501 Oral  ?   SpO2 05/13/21 1501 99 %  ?   Weight --   ?   Height --   ?   Head Circumference --   ?   Peak Flow --   ?   Pain Score 05/13/21 1504 2  ?   Pain Loc --   ?   Pain Edu? --   ?   Excl. in GC? --   ? ?No data found. ? ?Updated Vital Signs ?BP (!) 149/92 (BP Location: Right Arm)  Pulse 87   Temp 98.6 ?F (37 ?C) (Oral)   Resp 14   SpO2 99%  ? ? ?Physical Exam ?Vitals and nursing note reviewed.  ?Constitutional:   ?   General: He is not in acute distress. ?   Appearance: Normal appearance. He is normal weight. He is not ill-appearing.  ?HENT:  ?   Head: Normocephalic and atraumatic.  ?   Mouth/Throat:  ?   Mouth: Mucous membranes are moist.  ?   Pharynx: Oropharynx is clear.  ?Eyes:  ?   Extraocular Movements: Extraocular movements intact.  ?   Conjunctiva/sclera: Conjunctivae normal.  ?   Pupils: Pupils are equal, round, and reactive to light.  ?Cardiovascular:  ?   Rate and Rhythm: Normal rate and regular rhythm.  ?   Pulses: Normal pulses.  ?   Heart sounds: Normal heart sounds.  ?Pulmonary:  ?   Effort: Pulmonary effort is normal.  ?   Breath sounds: Normal breath sounds. No wheezing, rhonchi or rales.  ?Abdominal:  ?   General: Bowel sounds are normal. There is no distension.  ?   Palpations: Abdomen is soft. There is no mass.  ?   Tenderness: There is no abdominal tenderness. There is no right CVA tenderness, left CVA tenderness, guarding or rebound.  ?   Hernia: No hernia is present.   ?Musculoskeletal:  ?   Cervical back: Normal range of motion and neck supple.  ?   Comments: Left-sided chest wall tenderness, mildly TTP under left 10th rib lateral inferior aspect  ?Skin: ?   General: Skin is warm and dry.  ?Neurological:  ?   General: No focal deficit present.  ?   Mental Status: He is alert and oriented to person, place, and time.  ? ? ? ?UC Treatments / Results  ?Labs ?(all labs ordered are listed, but only abnormal results are displayed) ?Labs Reviewed  ?CBC WITH DIFFERENTIAL/PLATELET  ?COMPLETE METABOLIC PANEL WITH GFR  ?POCT URINALYSIS DIP (MANUAL ENTRY)  ? ? ?EKG ? ? ?Radiology ?DG Chest 2 View ? ?Result Date: 05/13/2021 ?CLINICAL DATA:  Atypical chest pain EXAM: CHEST - 2 VIEW COMPARISON:  None. FINDINGS: The heart size and mediastinal contours are within normal limits. Both lungs are clear. The visualized skeletal structures are unremarkable. IMPRESSION: No active cardiopulmonary disease. Electronically Signed   By: Allegra LaiLeah  Strickland M.D.   On: 05/13/2021 15:36   ? ?Procedures ?Procedures (including critical care time) ? ?Medications Ordered in UC ?Medications - No data to display ? ?Initial Impression / Assessment and Plan / UC Course  ?I have reviewed the triage vital signs and the nursing notes. ? ?Pertinent labs & imaging results that were available during my care of the patient were reviewed by me and considered in my medical decision making (see chart for details). ? ?  ? ?MDM: 1.  Atypical chest pain-CXR revealed no acute cardiopulmonary process, CBC with differential, and CMP ordered; 2.  Chest wall pain (left-sided)-Rx'd Celebrex. Advised/informed patient of chest x-ray results-no acute cardiopulmonary process noted, no abnormalities.  Advised patient will treat empirically for costochondritis for the next 2 weeks with Celebrex.  Advised patient we will follow-up with him tomorrow, Friday, 05/14/2021 with lab results.  Advised patient if symptoms worsen and or unresolved please  follow-up with PCP or here for further evaluation.  Patient discharged home, hemodynamically stable.   ?Final Clinical Impressions(s) / UC Diagnoses  ? ?Final diagnoses:  ?Atypical chest pain  ?Left-sided chest wall pain  ? ? ? ?  Discharge Instructions   ? ?  ?Advised/informed patient of chest x-ray results-no acute cardiopulmonary process noted, no abnormalities.  Advised patient will treat empirically for costochondritis for the next 2 weeks with Celebrex.  Advised patient we will follow-up with him tomorrow, Friday, 05/14/2021 with lab results.  Advised patient if symptoms worsen and or unresolved please follow-up with PCP or here for further evaluation. ? ? ? ? ?ED Prescriptions   ? ? Medication Sig Dispense Auth. Provider  ? celecoxib (CELEBREX) 100 MG capsule Take 1 capsule (100 mg total) by mouth 2 (two) times daily for 15 days. 30 capsule Trevor Iha, FNP  ? ?  ? ?PDMP not reviewed this encounter. ?  ?Trevor Iha, FNP ?05/13/21 1557 ? ?

## 2021-05-14 LAB — COMPLETE METABOLIC PANEL WITH GFR
AG Ratio: 2.1 (calc) (ref 1.0–2.5)
ALT: 26 U/L (ref 9–46)
AST: 14 U/L (ref 10–40)
Albumin: 4.5 g/dL (ref 3.6–5.1)
Alkaline phosphatase (APISO): 89 U/L (ref 36–130)
BUN: 17 mg/dL (ref 7–25)
CO2: 25 mmol/L (ref 20–32)
Calcium: 9.8 mg/dL (ref 8.6–10.3)
Chloride: 103 mmol/L (ref 98–110)
Creat: 0.96 mg/dL (ref 0.60–1.29)
Globulin: 2.1 g/dL (calc) (ref 1.9–3.7)
Glucose, Bld: 100 mg/dL — ABNORMAL HIGH (ref 65–99)
Potassium: 4.2 mmol/L (ref 3.5–5.3)
Sodium: 140 mmol/L (ref 135–146)
Total Bilirubin: 0.4 mg/dL (ref 0.2–1.2)
Total Protein: 6.6 g/dL (ref 6.1–8.1)
eGFR: 101 mL/min/{1.73_m2} (ref >=60)

## 2021-05-14 LAB — CBC WITH DIFFERENTIAL/PLATELET
Absolute Monocytes: 502 cells/uL (ref 200–950)
Basophils Absolute: 68 cells/uL (ref 0–200)
Basophils Relative: 0.9 %
Eosinophils Absolute: 456 cells/uL (ref 15–500)
Eosinophils Relative: 6 %
HCT: 44.3 % (ref 38.5–50.0)
Hemoglobin: 15.5 g/dL (ref 13.2–17.1)
Lymphs Abs: 2174 cells/uL (ref 850–3900)
MCH: 31.4 pg (ref 27.0–33.0)
MCHC: 35 g/dL (ref 32.0–36.0)
MCV: 89.7 fL (ref 80.0–100.0)
MPV: 10.5 fL (ref 7.5–12.5)
Monocytes Relative: 6.6 %
Neutro Abs: 4400 cells/uL (ref 1500–7800)
Neutrophils Relative %: 57.9 %
Platelets: 185 10*3/uL (ref 140–400)
RBC: 4.94 10*6/uL (ref 4.20–5.80)
RDW: 12.4 % (ref 11.0–15.0)
Total Lymphocyte: 28.6 %
WBC: 7.6 10*3/uL (ref 3.8–10.8)

## 2021-05-25 ENCOUNTER — Ambulatory Visit: Payer: BC Managed Care – PPO | Admitting: Sports Medicine

## 2021-05-25 DIAGNOSIS — R0789 Other chest pain: Secondary | ICD-10-CM | POA: Diagnosis not present

## 2021-05-25 MED ORDER — PREDNISONE 50 MG PO TABS: 50.0000 mg | ORAL_TABLET | Freq: Every day | ORAL | 0 refills | Status: DC

## 2021-05-25 NOTE — Progress Notes (Signed)
? ? ?  Procedures performed today:   ? ?None. ? ?Independent interpretation of notes and tests performed by another provider:  ? ?None. ? ?Brief History, Exam, Impression, and Recommendations:   ? ?Chest wall pain ?Hayden Patrick is a very pleasant 46 year old male, he is a Veterinary surgeon, he also is very active in his church, he has noted for the past 6+ weeks pain left-sided chest wall, from just lateral to the thoracic spine around to the left costal margin, no associated symptoms such as fevers, chills, night sweats, weight loss, cough, runny nose, GI symptoms, no melena, hematochezia, hematemesis, no overt trauma. ?Pain is not worse with exertion, not better with rest and not pleuritic. ?He was seen in urgent care, chest x-ray, CBC, CMP, urinalysis was negative. ?On exam I am not able to reproduce his pain, auscultation is normal, no lower extremity swelling. ?Considering the greater than 6 weeks duration of chest wall pain without cross-sectional imaging I think we need a CT of the chest with IV contrast looking for a mass, we will get a good view of his thoracic spine at the same time. ?I would also like him to do some prednisone in the meantime. ?We will see him back to go over the CT results. ? ?Chronic process with exacerbation and pharmacologic intervention ? ?___________________________________________ ?Ihor Austin. Benjamin Stain, M.D., ABFM., CAQSM. ?Primary Care and Sports Medicine ?[facility] ? ?Adjunct Instructor of Family Medicine  ?[employer] ?

## 2021-05-25 NOTE — Assessment & Plan Note (Signed)
Hayden Patrick is a very pleasant 46 year old male, he is a Veterinary surgeon, he also is very active in his church, he has noted for the past 6+ weeks pain left-sided chest wall, from just lateral to the thoracic spine around to the left costal margin, no associated symptoms such as fevers, chills, night sweats, weight loss, cough, runny nose, GI symptoms, no melena, hematochezia, hematemesis, no overt trauma. ?Pain is not worse with exertion, not better with rest and not pleuritic. ?He was seen in urgent care, chest x-ray, CBC, CMP, urinalysis was negative. ?On exam I am not able to reproduce his pain, auscultation is normal, no lower extremity swelling. ?Considering the greater than 6 weeks duration of chest wall pain without cross-sectional imaging I think we need a CT of the chest with IV contrast looking for a mass, we will get a good view of his thoracic spine at the same time. ?I would also like him to do some prednisone in the meantime. ?We will see him back to go over the CT results. ?

## 2021-05-27 ENCOUNTER — Ambulatory Visit (INDEPENDENT_AMBULATORY_CARE_PROVIDER_SITE_OTHER): Payer: BC Managed Care – PPO

## 2021-05-27 DIAGNOSIS — R222 Localized swelling, mass and lump, trunk: Secondary | ICD-10-CM

## 2021-05-27 DIAGNOSIS — R0789 Other chest pain: Secondary | ICD-10-CM | POA: Diagnosis not present

## 2021-05-27 DIAGNOSIS — R079 Chest pain, unspecified: Secondary | ICD-10-CM | POA: Diagnosis not present

## 2021-05-27 MED ORDER — IOHEXOL 300 MG/ML  SOLN
100.0000 mL | Freq: Once | INTRAMUSCULAR | Status: AC | PRN
  Administered 2021-05-27: 75 mL via INTRAVENOUS

## 2021-05-28 ENCOUNTER — Encounter: Payer: Self-pay | Admitting: Sports Medicine

## 2021-07-05 DIAGNOSIS — M546 Pain in thoracic spine: Secondary | ICD-10-CM | POA: Diagnosis not present

## 2021-07-13 DIAGNOSIS — M546 Pain in thoracic spine: Secondary | ICD-10-CM | POA: Diagnosis not present

## 2021-07-26 DIAGNOSIS — M546 Pain in thoracic spine: Secondary | ICD-10-CM | POA: Diagnosis not present

## 2021-08-09 DIAGNOSIS — M546 Pain in thoracic spine: Secondary | ICD-10-CM | POA: Diagnosis not present

## 2021-09-16 DIAGNOSIS — M546 Pain in thoracic spine: Secondary | ICD-10-CM | POA: Diagnosis not present

## 2022-01-04 ENCOUNTER — Ambulatory Visit
Admission: RE | Admit: 2022-01-04 | Discharge: 2022-01-04 | Disposition: A | Discharge status: Home / Self Care | Payer: BC Managed Care – PPO | Source: Ambulatory Visit | Attending: Family Medicine | Admitting: Family Medicine

## 2022-01-04 ENCOUNTER — Ambulatory Visit (INDEPENDENT_AMBULATORY_CARE_PROVIDER_SITE_OTHER): Payer: BC Managed Care – PPO

## 2022-01-04 VITALS — BP 151/95 | HR 84 | Temp 99.0°F | Resp 14 | Ht 71.0 in | Wt 180.0 lb

## 2022-01-04 DIAGNOSIS — M79641 Pain in right hand: Secondary | ICD-10-CM | POA: Diagnosis not present

## 2022-01-04 DIAGNOSIS — M79602 Pain in left arm: Secondary | ICD-10-CM | POA: Diagnosis not present

## 2022-01-04 DIAGNOSIS — S40022A Contusion of left upper arm, initial encounter: Secondary | ICD-10-CM

## 2022-01-04 DIAGNOSIS — S6992XA Unspecified injury of left wrist, hand and finger(s), initial encounter: Secondary | ICD-10-CM | POA: Diagnosis not present

## 2022-01-04 DIAGNOSIS — M79622 Pain in left upper arm: Secondary | ICD-10-CM | POA: Diagnosis not present

## 2022-01-04 DIAGNOSIS — S59912A Unspecified injury of left forearm, initial encounter: Secondary | ICD-10-CM | POA: Diagnosis not present

## 2022-01-04 NOTE — ED Triage Notes (Signed)
Left hand &  forearm injury  Broke a board at martial arts last night Pain to top of left  hand  & left FA Bruise noted to Left FA  Pain and stiffness per pt  No OTC meds

## 2022-01-04 NOTE — Discharge Instructions (Addendum)
Take ibuprofen 600 mg to 800 mg as needed for pain.  Take with food, May take 3 times a day Use ice to reduce pain and swelling.  Ice for 20 minutes every couple of hours Limit heavy use of hand and arm for the next few days Sports medicine if you fail to improve by next week

## 2022-01-04 NOTE — ED Provider Notes (Addendum)
[facility]    CSN: [id number] Arrival date & time: 01/04/22  9983      History   Chief Complaint Chief Complaint  Patient presents with   Arm Injury    Left     HPI Hayden Patrick is a 46 y.o. male.   HPI  Patient broke a board with his left hand performing a martial arts maneuver last night.  Today he is having pain in his left hand and wrist.  Desires x-ray  Past Medical History:  Diagnosis Date   Back pain    DEPRESSION 08/16/2007   Qualifier: Diagnosis of  By: Cathey Endow DO, Karen     Inguinal hernia bilateral    Patient Active Problem List   Diagnosis Date Noted   Chest wall pain 05/25/2021   Fracture of great toe, right, closed 10/02/2019   Essential hypertension 05/02/2014   Hyperlipidemia 02/07/2013   Ventral hernia 02/06/2013   Right groin pain 12/07/2010   TINEA VERSICOLOR 09/03/2009    Past Surgical History:  Procedure Laterality Date   HERNIA REPAIR  1990, 2002   LIH   left inguinal hernia x 2     TONSILLECTOMY         Home Medications    Prior to Admission medications   Not on File    Family History Family History  Problem Relation Age of Onset   Arthritis Mother    Hypertension Father    Heart failure Father    Hyperlipidemia Father    Heart attack Neg Hx    Diabetes Neg Hx    Sudden death Neg Hx     Social History Social History   Tobacco Use   Smoking status: Never   Smokeless tobacco: Never  Vaping Use   Vaping Use: Never used  Substance Use Topics   Alcohol use: Not Currently    Comment: rare - holidays & vacation   Drug use: No     Allergies   Patient has no known allergies.   Review of Systems Review of Systems See HPI  Physical Exam Triage Vital Signs ED Triage Vitals  Enc Vitals Group     BP 01/04/22 0951 (!) 151/95     Pulse Rate 01/04/22 0951 84     Resp 01/04/22 0951 14     Temp 01/04/22 0951 99 F (37.2 C)     Temp Source 01/04/22 0951 Oral     SpO2 01/04/22 0951 100 %      Weight 01/04/22 0952 180 lb (81.6 kg)     Height 01/04/22 0952 5\' 11"  (1.803 m)     Head Circumference --      Peak Flow --      Pain Score 01/04/22 0952 4     Pain Loc --      Pain Edu? --      Excl. in GC? --    No data found.  Updated Vital Signs BP (!) 151/95 (BP Location: Right Arm) Comment: white coat  syndrome per pt  Pulse 84   Temp 99 F (37.2 C) (Oral)   Resp 14   Ht 5\' 11"  (1.803 m)   Wt 81.6 kg   SpO2 100%   BMI 25.10 kg/m      Physical Exam Constitutional:      General: He is not in acute distress.    Appearance: He is well-developed.  HENT:     Head: Normocephalic and atraumatic.  Eyes:     Conjunctiva/sclera: Conjunctivae  normal.     Pupils: Pupils are equal, round, and reactive to light.  Cardiovascular:     Rate and Rhythm: Normal rate.  Pulmonary:     Effort: Pulmonary effort is normal. No respiratory distress.  Abdominal:     General: There is no distension.     Palpations: Abdomen is soft.  Musculoskeletal:        General: Swelling and tenderness present. Normal range of motion.     Cervical back: Normal range of motion.     Comments: There is a bruise and tenderness on the distal ulna approximately 5 cm from the styloid.  There is also some tenderness over the third and fourth metacarpal regions of the hand.  X-rays are negative  Skin:    General: Skin is warm and dry.  Neurological:     Mental Status: He is alert.      UC Treatments / Results  Labs (all labs ordered are listed, but only abnormal results are displayed) Labs Reviewed - No data to display  EKG   Radiology DG Forearm Left  Result Date: 01/04/2022 CLINICAL DATA:  Left arm pain after injury last night. EXAM: LEFT FOREARM - 2 VIEW COMPARISON:  None Available. FINDINGS: There is no evidence of fracture or other focal bone lesions. Soft tissues are unremarkable. IMPRESSION: Negative. Electronically Signed   By: Lupita Raider M.D.   On: 01/04/2022 10:22   DG Hand Complete  Left  Result Date: 01/04/2022 CLINICAL DATA:  Right hand pain after injury last night. EXAM: LEFT HAND - COMPLETE 3+ VIEW COMPARISON:  None Available. FINDINGS: There is no evidence of fracture or dislocation. There is no evidence of arthropathy or other focal bone abnormality. Soft tissues are unremarkable. IMPRESSION: Negative. Electronically Signed   By: Lupita Raider M.D.   On: 01/04/2022 10:21    Procedures Procedures (including critical care time)  Medications Ordered in UC Medications - No data to display  Initial Impression / Assessment and Plan / UC Course  I have reviewed the triage vital signs and the nursing notes.  Pertinent labs & imaging results that were available during my care of the patient were reviewed by me and considered in my medical decision making (see chart for details).     Final Clinical Impressions(s) / UC Diagnoses   Final diagnoses:  Armpit pain, left  Contusion of left upper extremity, initial encounter     Discharge Instructions      Take ibuprofen 600 mg to 800 mg as needed for pain.  Take with food, May take 3 times a day Use ice to reduce pain and swelling.  Ice for 20 minutes every couple of hours Limit heavy use of hand and arm for the next few days Sports medicine if you fail to improve by next week     ED Prescriptions   None    PDMP not reviewed this encounter.   Eustace Moore, MD 01/04/22 1043    Eustace Moore, MD 01/04/22 [phone]

## 2022-08-15 ENCOUNTER — Ambulatory Visit: Payer: Self-pay

## 2022-11-25 ENCOUNTER — Ambulatory Visit: Payer: BC Managed Care – PPO

## 2022-11-25 ENCOUNTER — Encounter: Payer: Self-pay | Admitting: Podiatry

## 2022-11-25 ENCOUNTER — Other Ambulatory Visit: Payer: Self-pay | Admitting: Podiatry

## 2022-11-25 ENCOUNTER — Ambulatory Visit: Payer: BC Managed Care – PPO | Admitting: Podiatry

## 2022-11-25 DIAGNOSIS — M79672 Pain in left foot: Secondary | ICD-10-CM

## 2022-11-25 DIAGNOSIS — M7672 Peroneal tendinitis, left leg: Secondary | ICD-10-CM

## 2022-11-25 DIAGNOSIS — G5782 Other specified mononeuropathies of left lower limb: Secondary | ICD-10-CM | POA: Diagnosis not present

## 2022-11-25 NOTE — Patient Instructions (Signed)
Peroneal Tendinopathy Rehab Ask your health care provider which exercises are safe for you. Do exercises exactly as told by your health care provider and adjust them as directed. It is normal to feel mild stretching, pulling, tightness, or discomfort as you do these exercises. Stop right away if you feel sudden pain or your pain gets worse. Do not begin these exercises until told by your health care provider. Stretching and range-of-motion exercises These exercises warm up your muscles and joints. They can help improve the movement and flexibility of your ankle. They may also help to relieve pain and stiffness. Gastrocnemius and soleus stretch, standing This is an exercise in which you stand on a step and use your body weight to stretch your calf muscles. To do this exercise: Stand on the edge of a step on the ball of your left / right foot. The ball of your foot is on the walking surface, right under your toes. Keep your other foot firmly on the same step. Hold on to the wall, a railing, or a chair for balance. Slowly lift your other foot, allowing your body weight to press your left / right heel down over the edge of the step. You should feel a stretch in your left / right calf (gastrocnemius and soleus). Hold this position for __________ seconds. Return both feet to the step. Repeat this exercise with a slight bend in your left / right knee. Repeat __________ times with your left / right knee straight and __________ times with your left / right knee bent. Complete this exercise __________ times a day. Strengthening exercises These exercises build strength and endurance in your foot and ankle. Endurance is the ability to use your muscles for a long time, even after they get tired. Ankle dorsiflexion with band  Secure a rubber exercise band or tube to an object, such as a table leg, that will not move when the band is pulled. Secure the other end of the band around your left / right foot. Sit on  the floor. Face the object with your left / right leg extended. The band or tube should be slightly tense when your foot is relaxed. Slowly flex your left / right ankle and toes to bring your foot toward you (dorsiflexion). Hold this position for __________ seconds. Let the band or tube slowly pull your foot back to the starting position. Repeat __________ times. Complete this exercise __________ times a day. Ankle eversion  Sit on the floor with your legs straight out in front of you. Loop a rubber exercise band or tube around the ball of your left / right foot. The ball of your foot is on the walking surface, right under your toes. Hold the ends of the band in your hands. You can also secure the band to a stable object. The band or tube should be slightly tense when your foot is relaxed. Slowly push your foot outward, away from your other leg (eversion). Hold this position for __________ seconds. Slowly return your foot to the starting position. Repeat __________ times. Complete this exercise __________ times a day. Plantar flexion, standing This exercise is sometimes called a standing heel raise. Stand with your feet shoulder-width apart. Place your hands on a wall or table to steady yourself as needed. Try not to use it for support. Keep your weight spread evenly over the width of your feet while you slowly rise up on your toes (plantar flexion). If told by your health care provider: Shift your weight   toward your left / right leg until you feel challenged. Stand on your left / right leg only. Hold this position for __________ seconds. Repeat __________ times. Complete this exercise __________ times a day. Single leg stand  Without shoes, stand near a railing or in a doorway. You may hold on to the railing or doorframe as needed. Stand on your left / right foot. Keep your big toe down on the floor and try to keep your arch lifted. Do not roll to the outside of your foot. If this  exercise is too easy, you can try it with your eyes closed or while standing on a pillow. Hold this position for __________ seconds. Repeat __________ times. Complete this exercise __________ times a day. This information is not intended to replace advice given to you by your health care provider. Make sure you discuss any questions you have with your health care provider. Document Revised: 05/13/2021 Document Reviewed: 05/13/2021 Elsevier Patient Education  2024 [employer]  

## 2022-11-25 NOTE — Progress Notes (Signed)
  Subjective:  Patient ID: Hayden Patrick, male    DOB: Dec 29, 1975,   MRN: [id number]  No chief complaint on file.   47 y.o. male presents for concern of left lateral heel pain that has been ongoing for a couple months. Relates he gets burning and tingling on the outside of his heel/ankle only when he has his leg in certain positions particularly when he is DF his foot. Denies any pain other wise but relates it is starting to affect him more. Denies any current treatments.    . Denies any other pedal complaints. Denies n/v/f/c.   Past Medical History:  Diagnosis Date   Back pain    DEPRESSION 08/16/2007   Qualifier: Diagnosis of  By: Cathey Endow DO, Karen     Inguinal hernia bilateral    Objective:  Physical Exam: Vascular: DP/PT pulses 2/4 bilateral. CFT <3 seconds. Normal hair growth on digits. No edema.  Skin. No lacerations or abrasions bilateral feet.  Musculoskeletal: MMT 5/5 bilateral lower extremities in DF, PF, Inversion and Eversion. Deceased ROM in DF of ankle joint. No pain to palpation around distal peroneal tendon insertion site. Very minimal tenderness around lateral ankle area in area of sural nerve. Negative tinels. Able to illicit pain with DF of the ankle. No pain around the common peroneal arount the fibular. Decreased ankle dorsiflexion and equinus noted on left.  Neurological: Sensation intact to light touch.   Assessment:   1. Sural neuritis, left   2. Peroneal tendonitis, left      Plan:  Patient was evaluated and treated and all questions answered. X-rays reviewed and discussed with patient. Discussed peroneal tendinitis and treatment options at length with patient Discussed stretching exercises and provided handout. Deferred prescription meloxicam and will take ibuprofen.  Amb ref to PT provided to help wit stretching and possibly dry needling Discussed that if the symptoms do not improve can consider neurology referral for possible NCV. Discussed may need nerve  decompression at some point.  Patient to return as needed   Louann Sjogren, DPM

## 2022-12-01 DIAGNOSIS — M7672 Peroneal tendinitis, left leg: Secondary | ICD-10-CM | POA: Diagnosis not present

## 2022-12-06 DIAGNOSIS — M7672 Peroneal tendinitis, left leg: Secondary | ICD-10-CM | POA: Diagnosis not present

## 2022-12-08 DIAGNOSIS — M7672 Peroneal tendinitis, left leg: Secondary | ICD-10-CM | POA: Diagnosis not present

## 2022-12-12 DIAGNOSIS — M7672 Peroneal tendinitis, left leg: Secondary | ICD-10-CM | POA: Diagnosis not present

## 2022-12-20 DIAGNOSIS — M7672 Peroneal tendinitis, left leg: Secondary | ICD-10-CM | POA: Diagnosis not present

## 2023-01-11 DIAGNOSIS — M7672 Peroneal tendinitis, left leg: Secondary | ICD-10-CM | POA: Diagnosis not present

## 2023-04-12 IMAGING — DX DG CHEST 2V
2 series · 2 of 2 positions shown · non-contrast
Comparison: None.

CLINICAL DATA: Atypical chest pain

EXAM:
CHEST - 2 VIEW

[chest pa]
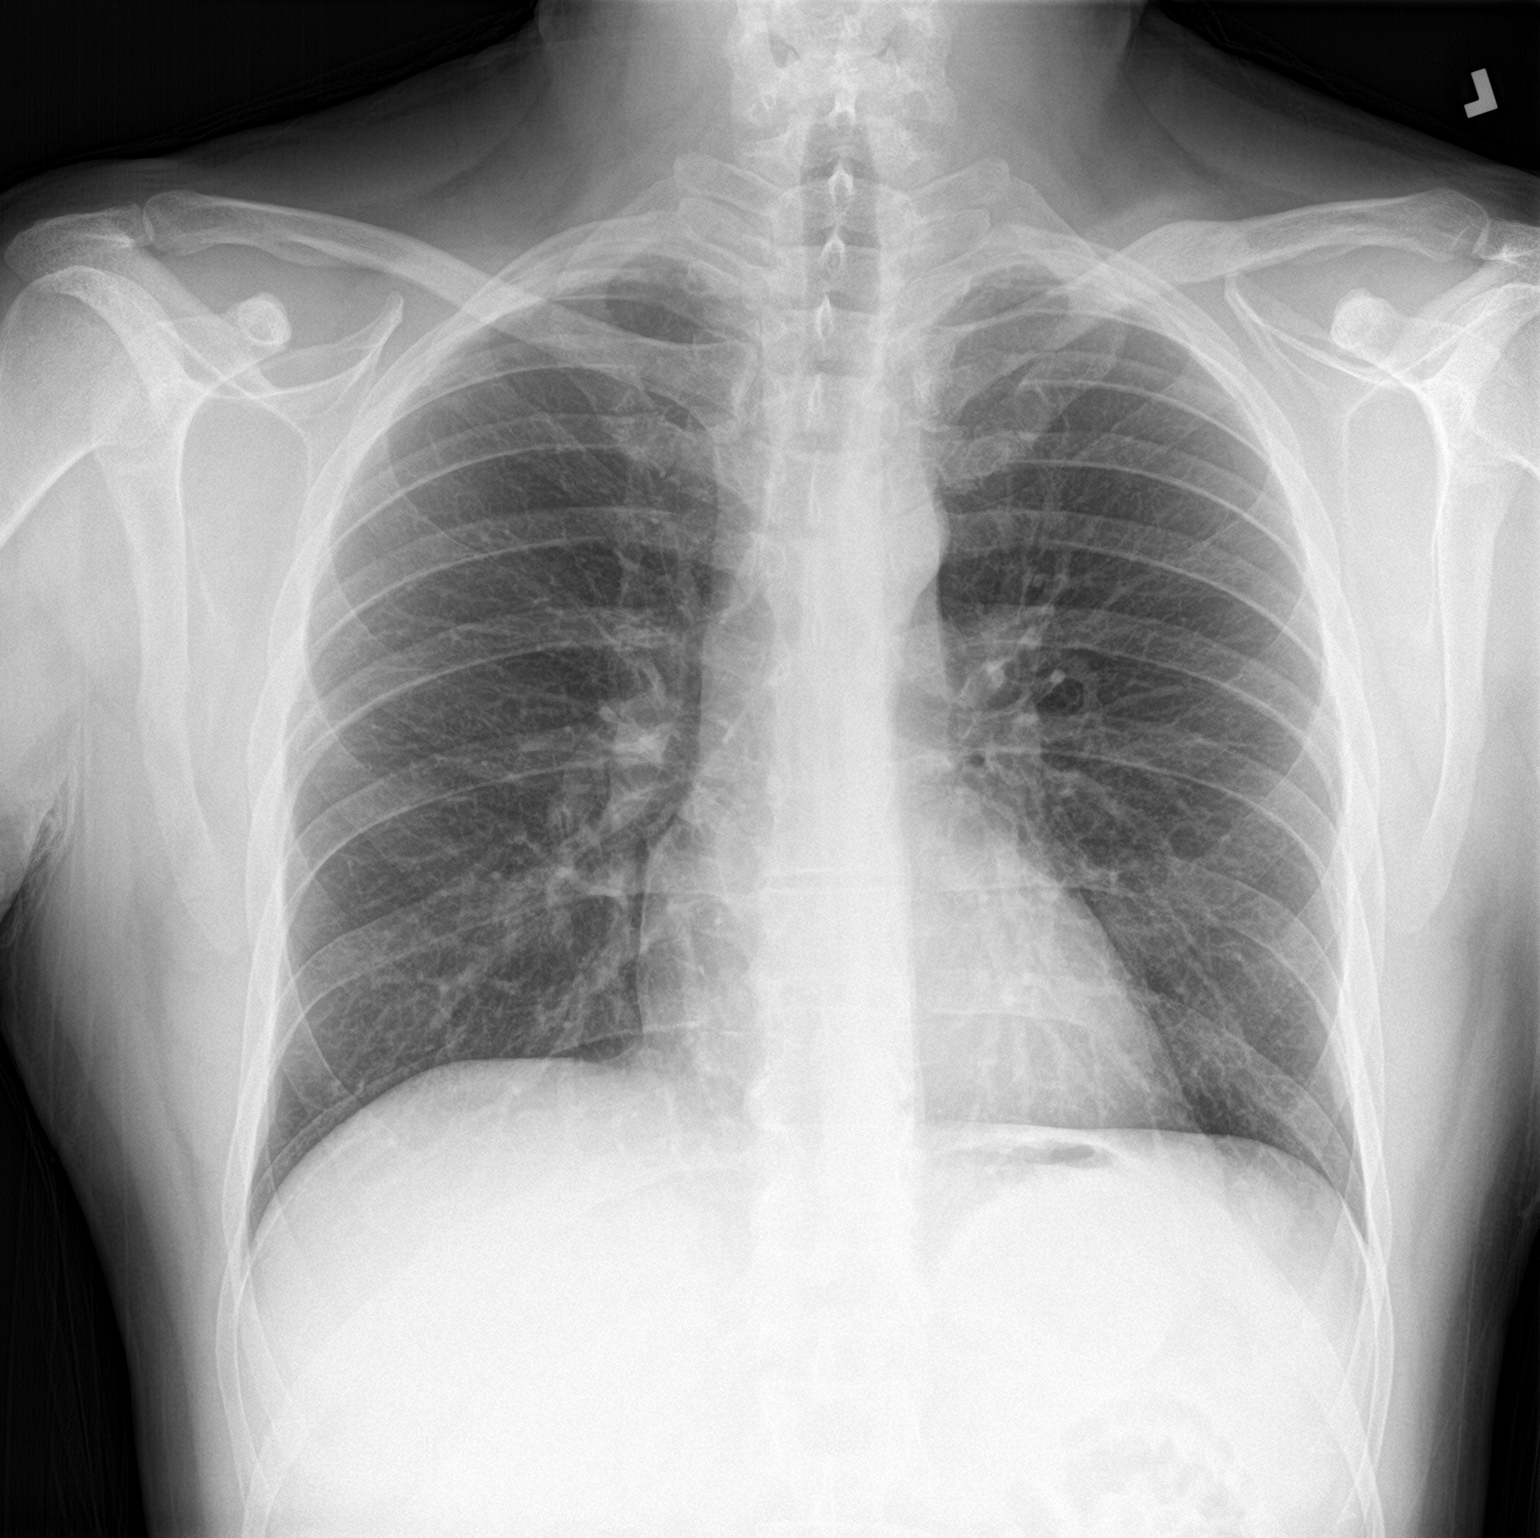

[chest lat]
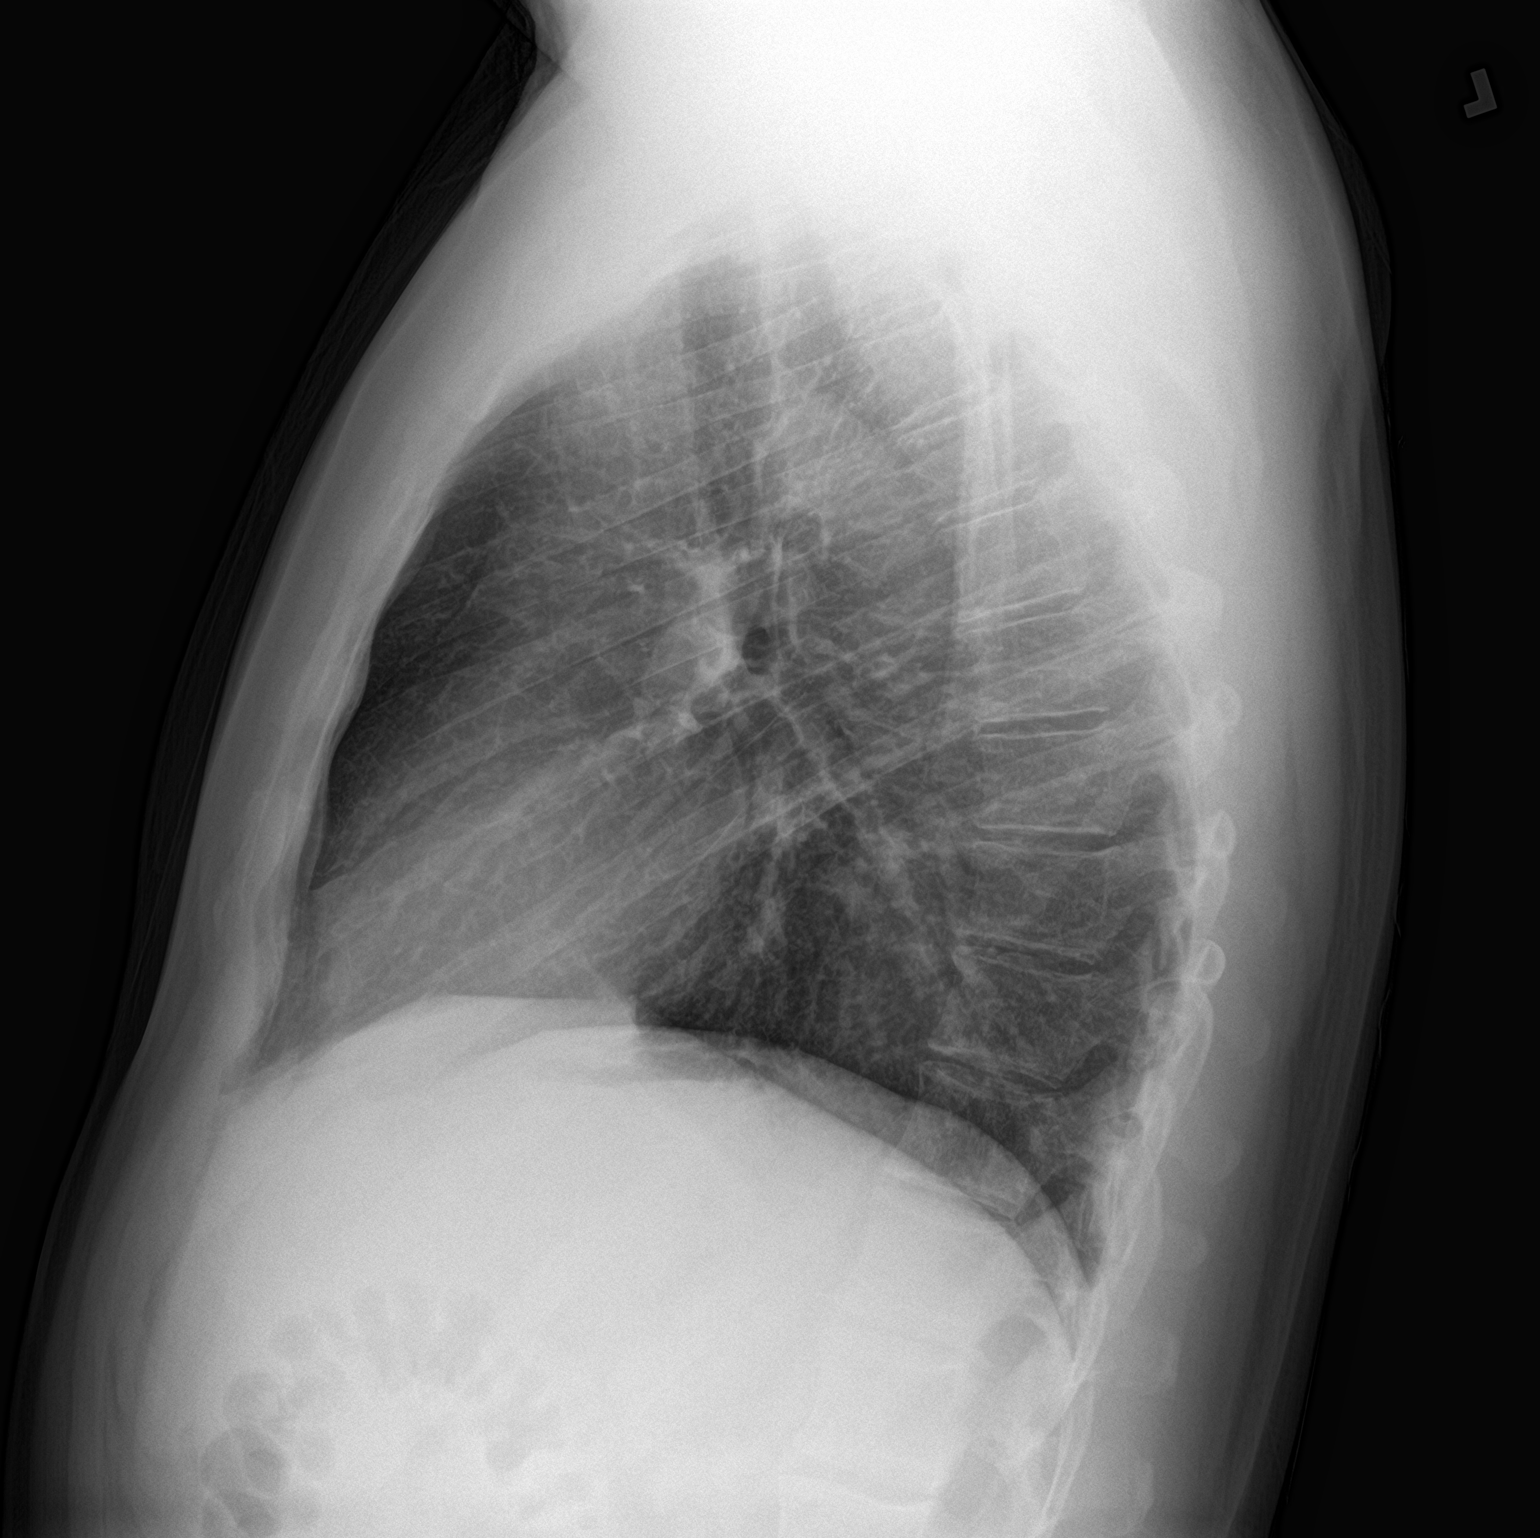

[2 of 2 positions shown; findings below may reference images not displayed]

FINDINGS: The heart size and mediastinal contours are within normal limits.
Both lungs are clear. The visualized skeletal structures are
unremarkable.
IMPRESSION: No active cardiopulmonary disease.

## 2023-06-14 ENCOUNTER — Ambulatory Visit: Payer: Self-pay

## 2023-06-16 DIAGNOSIS — I10 Essential (primary) hypertension: Secondary | ICD-10-CM | POA: Diagnosis not present

## 2023-06-16 DIAGNOSIS — Z1330 Encounter for screening examination for mental health and behavioral disorders, unspecified: Secondary | ICD-10-CM | POA: Diagnosis not present

## 2023-08-31 DIAGNOSIS — L814 Other melanin hyperpigmentation: Secondary | ICD-10-CM | POA: Diagnosis not present

## 2023-08-31 DIAGNOSIS — L578 Other skin changes due to chronic exposure to nonionizing radiation: Secondary | ICD-10-CM | POA: Diagnosis not present

## 2023-08-31 DIAGNOSIS — L821 Other seborrheic keratosis: Secondary | ICD-10-CM | POA: Diagnosis not present

## 2023-08-31 DIAGNOSIS — D225 Melanocytic nevi of trunk: Secondary | ICD-10-CM | POA: Diagnosis not present

## 2023-09-09 ENCOUNTER — Ambulatory Visit: Payer: Self-pay

## 2023-09-22 DIAGNOSIS — I10 Essential (primary) hypertension: Secondary | ICD-10-CM | POA: Diagnosis not present

## 2023-09-22 DIAGNOSIS — Z6825 Body mass index (BMI) 25.0-25.9, adult: Secondary | ICD-10-CM | POA: Diagnosis not present

## 2023-09-22 DIAGNOSIS — Z13220 Encounter for screening for lipoid disorders: Secondary | ICD-10-CM | POA: Diagnosis not present

## 2023-09-22 DIAGNOSIS — Z0000 Encounter for general adult medical examination without abnormal findings: Secondary | ICD-10-CM | POA: Diagnosis not present

## 2023-09-22 DIAGNOSIS — Z1211 Encounter for screening for malignant neoplasm of colon: Secondary | ICD-10-CM | POA: Diagnosis not present

## 2023-10-03 ENCOUNTER — Encounter: Payer: Self-pay | Admitting: Sports Medicine
